# Patient Record
Sex: Female | Born: 1970 | Race: White | Hispanic: No | Marital: Married | Smoking: Never smoker
Health system: Southern US, Community
[De-identification: ages and names within clinical notes are randomized; demographics above are authoritative.]

## PROBLEM LIST (undated history)

## (undated) DIAGNOSIS — K219 Gastro-esophageal reflux disease without esophagitis: Secondary | ICD-10-CM

## (undated) DIAGNOSIS — I10 Essential (primary) hypertension: Secondary | ICD-10-CM

## (undated) HISTORY — PX: OTHER SURGICAL HISTORY: SHX169

## (undated) HISTORY — PX: ESOPHAGOGASTRODUODENOSCOPY: SHX1529

---

## 2000-03-11 HISTORY — PX: TUBAL LIGATION: SHX77

## 2003-11-21 ENCOUNTER — Ambulatory Visit: Payer: Self-pay | Admitting: Family Medicine

## 2004-11-26 ENCOUNTER — Ambulatory Visit: Payer: Self-pay | Admitting: Unknown Physician Specialty

## 2004-12-03 ENCOUNTER — Ambulatory Visit: Payer: Self-pay | Admitting: Unknown Physician Specialty

## 2005-12-21 ENCOUNTER — Ambulatory Visit: Payer: Self-pay | Admitting: Unknown Physician Specialty

## 2005-12-24 ENCOUNTER — Ambulatory Visit: Payer: Self-pay | Admitting: Unknown Physician Specialty

## 2006-03-02 ENCOUNTER — Emergency Department: Payer: Self-pay | Admitting: Unknown Physician Specialty

## 2006-03-02 ENCOUNTER — Other Ambulatory Visit: Payer: Self-pay

## 2006-03-03 ENCOUNTER — Ambulatory Visit: Payer: Self-pay | Admitting: Unknown Physician Specialty

## 2006-07-06 ENCOUNTER — Ambulatory Visit: Payer: Self-pay | Admitting: Unknown Physician Specialty

## 2007-12-28 ENCOUNTER — Ambulatory Visit: Payer: Self-pay | Admitting: Unknown Physician Specialty

## 2008-08-30 HISTORY — PX: COLONOSCOPY: SHX174

## 2008-10-16 ENCOUNTER — Emergency Department: Payer: Self-pay | Admitting: Emergency Medicine

## 2008-12-30 ENCOUNTER — Ambulatory Visit: Payer: Self-pay | Admitting: Unknown Physician Specialty

## 2009-01-20 ENCOUNTER — Ambulatory Visit: Payer: Self-pay | Admitting: Unknown Physician Specialty

## 2009-06-06 ENCOUNTER — Emergency Department: Payer: Self-pay | Admitting: Emergency Medicine

## 2009-11-18 ENCOUNTER — Ambulatory Visit: Payer: Self-pay | Admitting: Unknown Physician Specialty

## 2010-03-03 ENCOUNTER — Ambulatory Visit
Admission: RE | Admit: 2010-03-03 | Discharge: 2010-03-03 | Payer: Self-pay | Source: Home / Self Care | Attending: Family Medicine | Admitting: Family Medicine

## 2010-03-03 DIAGNOSIS — I10 Essential (primary) hypertension: Secondary | ICD-10-CM | POA: Insufficient documentation

## 2010-03-04 ENCOUNTER — Ambulatory Visit
Admission: RE | Admit: 2010-03-04 | Discharge: 2010-03-04 | Payer: Self-pay | Source: Home / Self Care | Attending: Family Medicine | Admitting: Family Medicine

## 2010-03-12 NOTE — Assessment & Plan Note (Signed)
Summary: KNOT ON RIGHT CHEEK AREA/EVM   Vital Signs:  Patient Profile:   40 Years Old Female CC:      Knot on the neck. Height:     65 inches Weight:      233 pounds BMI:     38.91 O2 Sat:      100 % O2 treatment:    Room Air Temp:     98.0 degrees F oral Pulse rate:   98 / minute Pulse rhythm:   regular Resp:     18 per minute BP sitting:   171 / 129  (right arm)  Pt. in pain?   yes    Location:   neck    Intensity:   7    Type:       aching  Vitals Entered By: Levonne Spiller EMT-P (March 03, 2010 12:45 PM)              Is Patient Diabetic? No  Does patient need assistance? Functional Status Self care Ambulation Normal     Serial Vital Signs/Assessments:  Time      Position  BP       Pulse  Resp  Temp     By 1:12                176/119                        Levonne Spiller EMT-P 1:25                169/123                        Levonne Spiller EMT-P   Current Allergies: No known allergies History of Present Illness History from: patient Reason for visit: see chief complaint Chief Complaint: Knot on the neck. History of Present Illness: The patient is reporting that for the past several days she has noticed a swelling on the right side of face and it has progressively getting bigger.  The patient says that it has been tender.  Pt says she has not been ill with URI symptoms or a cold or dental problems or even sinus problems.  The patient says that she has been concerned about it because she has had tenderness and swelling when opening the jaw and eating.  No excessive loss of mouth moisture noted.  The patient says that she did take her BP meds today but has had a problem with getting her BP under better control and is working with her PCP.   The patient denies difficulty with swallowing or breathing.   The patient denies CP and SOB.    REVIEW OF SYSTEMS Constitutional Symptoms      Denies fever, chills, night sweats, weight loss, weight gain, and fatigue.  Eyes       Denies change in vision, eye pain, eye discharge, glasses, contact lenses, and eye surgery. Ear/Nose/Throat/Mouth       Denies hearing loss/aids, change in hearing, ear pain, ear discharge, dizziness, frequent runny nose, frequent nose bleeds, sinus problems, sore throat, hoarseness, and tooth pain or bleeding.  Respiratory       Denies dry cough, productive cough, wheezing, shortness of breath, asthma, bronchitis, and emphysema/COPD.  Cardiovascular       Denies murmurs, chest pain, and tires easily with exhertion.    Gastrointestinal       Denies stomach pain, nausea/vomiting, diarrhea, constipation, blood in bowel  movements, and indigestion. Genitourniary       Denies painful urination, blood or discharge from vagina, kidney stones, and loss of urinary control. Neurological       Complains of headaches.      Denies paralysis, seizures, and fainting/blackouts. Musculoskeletal       Denies muscle pain, joint pain, joint stiffness, decreased range of motion, redness, swelling, muscle weakness, and gout.  Skin       Complains of unusual moles/lumps or sores.      Denies bruising and hair/skin or nail changes.  Psych       Denies mood changes, temper/anger issues, anxiety/stress, speech problems, depression, and sleep problems.  Past History:  Family History: Last updated: 10-Mar-2010 Both Parents deceased per patient  Social History: Last updated: Mar 10, 2010 Never Smoked Alcohol use-no Drug use-no Occupation:  Nurse, children's  Risk Factors: Smoking Status: never (March 10, 2010)  Past Medical History: HTN  Past Surgical History: BTL - February 2003      Family History: Both Parents deceased per patient  Social History: Never Smoked Alcohol use-no Drug use-no Occupation:  Nurse, children's Smoking Status:  never Drug Use:  no Physical Exam General appearance: well developed, well nourished, no acute distress Head:  normocephalic, atraumatic Eyes: conjunctivae and lids normal Pupils: equal, round, reactive to light Ears: normal, no lesions or deformities Nasal: mucosa pink, nonedematous, no septal deviation, turbinates normal Oral/Pharynx: tongue normal, posterior pharynx without erythema or exudate Neck: swollen right parotid gland, no other adenopathy palpated or seen, neck supple,  trachea midline, no masses Chest/Lungs: no rales, wheezes, or rhonchi bilateral, breath sounds equal without effort Heart: regular rate and  rhythm, no murmur Abdomen: soft, non-tender without obvious organomegaly Extremities: normal extremities Neurological: grossly intact and non-focal Skin: no obvious rashes or lesions MSE: oriented to time, place, and person Assessment  Assessed UNSPECIFIED ESSENTIAL HYPERTENSION as deteriorated - Standley Dakins MD New Problems: UNSPECIFIED ESSENTIAL HYPERTENSION (ICD-401.9) SIALADENITIS, RIGHT (ICD-527.2)   Patient Education: Patient and/or caregiver instructed in the following: rest, Tylenol prn. The risks, benefits and possible side effects were clearly explained and discussed with the patient.  The patient verbalized clear understanding.  The patient was given instructions to return if symptoms don't improve, worsen or new changes develop.  If it is not during clinic hours and the patient cannot get back to this clinic then the patient was told to seek medical care at an available urgent care or emergency department.  The patient verbalized understanding.   Demonstrates willingness to comply.  Plan New Medications/Changes: AUGMENTIN 875-125 MG TABS (AMOXICILLIN-POT CLAVULANATE) take 1 by mouth two times a day  #14 x 0, 10-Mar-2010, Standley Dakins MD  Planning Comments:   The patient was given instructions to monitor her BP closely over the course of the day.  She has a home BP monitor.  The patient says that she will see her PCP to discuss her BP in the next several  days. She was instructed to start taking the antibiotics now and to return tomorrow to be re-evaluated. The patient verbalized clear understanding.     Follow Up: Follow up in 1-2 days if no improvement, Follow up on an as needed basis, Follow up with Primary Physician  The patient and/or caregiver has been counseled thoroughly with regard to medications prescribed including dosage, schedule, interactions, rationale for use, and possible side effects and they verbalize understanding.  Diagnoses and expected course of recovery discussed and will return if not improved as  expected or if the condition worsens. Patient and/or caregiver verbalized understanding.  Prescriptions: AUGMENTIN 875-125 MG TABS (AMOXICILLIN-POT CLAVULANATE) take 1 by mouth two times a day  #14 x 0   Entered and Authorized by:   Standley Dakins MD   Signed by:   Standley Dakins MD on 03/03/2010   Method used:   Electronically to        [facility] [address] (retail)       [address]       [place], [place]  [address]       Ph: [phone]       Fax: [phone]   RxID:   [id number]   Patient Instructions: 1)  Check your Blood Pressure regularly. If it is above:140/90  you should make an appointment. 2)  Go to the pharmacy and pick up your prescription (s).  It may take up to 30 mins for electronic prescriptions to be delivered to the pharmacy.  Please call if your pharmacy has not received your prescriptions after 30 minutes.   3)  The risks, benefits and possible side effects were clearly explained and discussed with the patient.  The patient verbalized clear understanding.  The patient was given instructions to return if symptoms don't improve, worsen or new changes develop.  If it is not during clinic hours and the patient cannot get back to this clinic then the patient was told to seek medical care at an available urgent care or emergency department.  The patient  verbalized understanding.    Medication Administration  Medication # 1:    Medication: Clonidine 0.1mg  tab    Diagnosis: HTN    Dose: 2 TABS    Route: po    Exp Date: 02/08/2011    Lot #: [id number]    Patient tolerated medication without complications    Given by: Levonne Spiller EMT-P (March 03, 2010 1:48 PM)    We monitored the patient in the office for 45 minutes and did serial BP assessments.  The patient was given instructions to Return or go to the ER if no improvement or symptoms getting worse.  The patient verbalized clear understanding.  The patient was counseled on the importance of controlling her BP and offered to refer her to a HTN specialist. The patient verbalized clear understanding.  Rodney Langton, MD, CDE, [employer]

## 2010-03-12 NOTE — Assessment & Plan Note (Signed)
Summary: RECHECK FROM YESTERDAY APPT/EVM   History of Present Illness: The patient was in the store and came to have her face evaluated.  She has been taking the antibiotics as prescribed and the swelling is improving.  The patient says that she is starting to feel better.  I told the patient to be sure to return if not resolved completely or new changes develop.  The patient verbalized understanding.   Rodney Langton, MD, CDE, FAAFP   Allergies: No Known Drug Allergies   Complete Medication List: 1)  Lisinopril-hydrochlorothiazide 20-25 Mg Tabs (Lisinopril-hydrochlorothiazide) .Marland Kitchen.. 1 tab per day 2)  Atenolol 100 Mg Tabs (Atenolol) .Marland Kitchen.. 1 tab per day 3)  Prilosec 20 Mg Cpdr (Omeprazole) .Marland Kitchen.. 1 per day 4)  Paxil Cr 37.5 Mg Xr24h-tab (Paroxetine hcl) .Marland Kitchen.. 1 tab per day 5)  Augmentin 875-125 Mg Tabs (Amoxicillin-pot clavulanate) .... Take 1 by mouth two times a day

## 2010-04-14 ENCOUNTER — Ambulatory Visit (INDEPENDENT_AMBULATORY_CARE_PROVIDER_SITE_OTHER): Payer: BC Managed Care – PPO | Admitting: Internal Medicine

## 2010-04-14 ENCOUNTER — Encounter: Payer: Self-pay | Admitting: Internal Medicine

## 2010-04-14 DIAGNOSIS — I10 Essential (primary) hypertension: Secondary | ICD-10-CM

## 2010-04-21 NOTE — Assessment & Plan Note (Signed)
Summary: BP HIGH AND HER LEFT ARM IS TINGLING   Vital Signs:  Patient Profile:   40 Years Old Female CC:      bp high and numbness in left arm starting today Height:     65 inches Pulse rate:   120 / minute BP sitting:   156 / 110  (right arm)                  Current Allergies: No known allergies History of Present Illness Chief Complaint: bp high and numbness in left arm starting today History of Present Illness: patient had her meds adjusted Feb by doubling atenolol and adding norvasc. She claims no missed doses. 1 week ago she started having pins and needles in legs intermittently and today started in left arm. Has genl headache 3/10 but it has been worse yest. Has intermittent nosebleeds which stop quickly, last yest. Denies chest pain, vision changes, limb weakness, speech or thought problems other than the anxiety of "raising 2 teens".  Patient reports all normal on general blood screen last month at Dr. Burnadette Peter [facility]. Never had thyroid problem.  REVIEW OF SYSTEMS Constitutional Symptoms      Denies fever, chills, night sweats, weight loss, weight gain, and fatigue.  Eyes       Denies change in vision, eye pain, eye discharge, glasses, contact lenses, and eye surgery. Ear/Nose/Throat/Mouth       Denies hearing loss/aids, change in hearing, ear pain, ear discharge, dizziness, frequent runny nose, frequent nose bleeds, sinus problems, sore throat, hoarseness, and tooth pain or bleeding.  Respiratory       Denies dry cough, productive cough, wheezing, shortness of breath, asthma, bronchitis, and emphysema/COPD.  Cardiovascular       Complains of tires easily with exhertion.      Denies murmurs and chest pain.    Gastrointestinal       Denies stomach pain, nausea/vomiting, diarrhea, constipation, blood in bowel movements, and indigestion. Genitourniary       Denies painful urination, kidney stones, and loss of urinary control. Neurological       Complains of  headaches, numbness, and tingling.      Denies paralysis, seizures, and fainting/blackouts. Musculoskeletal       Denies muscle pain, joint pain, joint stiffness, decreased range of motion, redness, swelling, muscle weakness, and gout.  Skin       Denies bruising, unusual mles/lumps or sores, and hair/skin or nail changes.  Psych       Denies mood changes, temper/anger issues, anxiety/stress, speech problems, depression, and sleep problems. Physical Exam General appearance: well developed, well nourished, no acute distress Head: normocephalic, atraumatic Eyes: conjunctivae and lids normal, fundi neg for h,e Pupils: equal, round, reactive to light Nasal: mucosa pink, nonedematous, no septal deviation, turbinates normal Oral/Pharynx: tongue normal, posterior pharynx without erythema or exudate Neck: neck supple,  trachea midline, no masses Thyroid: no nodules, masses, tenderness, or enlargement Chest/Lungs: no rales, wheezes, or rhonchi bilateral, breath sounds equal without effort Heart: regular rate and  rhythm, no murmur Abdomen: soft, non-tender without obvious organomegaly. no bruits Extremities: normal extremities. good pulses and cap refill Neurological: grossly intact and non-focal. intact soft touch. Skin: no obvious rashes or lesions MSE: oriented to time, place, and person Assessment New Problems: UNSPECIFIED TACHYCARDIA (ICD-785.0) HYPERTENSION (ICD-401.1)   Plan New Medications/Changes: PROPRANOLOL HCL 20 MG TABS (PROPRANOLOL HCL) 1 by mouth q6-8 hrs as needed rapid pulse  #30 x 0, 04/14/2010, J. Juline Patch  MD   The patient and/or caregiver has been counseled thoroughly with regard to medications prescribed including dosage, schedule, interactions, rationale for use, and possible side effects and they verbalize understanding.  Diagnoses and expected course of recovery discussed and will return if not improved as expected or if the condition worsens. Patient and/or  caregiver verbalized understanding.  Prescriptions: PROPRANOLOL HCL 20 MG TABS (PROPRANOLOL HCL) 1 by mouth q6-8 hrs as needed rapid pulse  #30 x 0   Entered and Authorized by:   J. Juline Patch MD   Signed by:   Shela Commons. Juline Patch MD on 04/14/2010   Method used:   Telephoned to ...       [facility] (retail)       [address], [address]       [place], [place]  [address]       Ph: [phone]       Fax: [phone]   RxID:   [id number]   Patient Instructions: 1)  avoid salt, caffeine, other stimulants. 2)  call Dr. Lin Givens today for recheck in near future and take all of your pill bottles.  3)  out of work rest of today. 4)  to emergency if severe headache, vision problems, nose bleed, chest pain. 5)  call was made to Dr. Silver Huguenin of [facility]. Vitals and eval reported. She will have staff call patient for recheck tomorrow.

## 2010-05-09 ENCOUNTER — Inpatient Hospital Stay: Payer: Self-pay | Admitting: Internal Medicine

## 2010-05-12 NOTE — Letter (Signed)
Summary: History Form  History Form   Imported By: Eugenio Hoes 05/04/2010 11:48:35  _____________________________________________________________________  External Attachment:    Type:   Image     Comment:   External Document

## 2011-04-13 ENCOUNTER — Ambulatory Visit: Payer: Self-pay | Admitting: Unknown Physician Specialty

## 2011-08-16 ENCOUNTER — Ambulatory Visit: Payer: Self-pay

## 2011-08-18 ENCOUNTER — Ambulatory Visit: Payer: Self-pay

## 2012-07-25 ENCOUNTER — Ambulatory Visit: Payer: Self-pay

## 2012-08-21 ENCOUNTER — Ambulatory Visit: Payer: Self-pay | Admitting: Unknown Physician Specialty

## 2012-08-22 LAB — PATHOLOGY REPORT

## 2013-03-07 IMAGING — US TRANSABDOMINAL ULTRASOUND OF PELVIS
1 series · 14 of 25 positions shown · non-contrast
Comparison: none

REASON FOR EXAM: STAT CR [id number] ask for Dr Nurse  abd pain generalized
RLQ pain
COMMENTS:

[Series 1: transabdominal ultrasound of pelvis · 0.28mm/px · 14 of 95 slices shown]
[im 1/95]
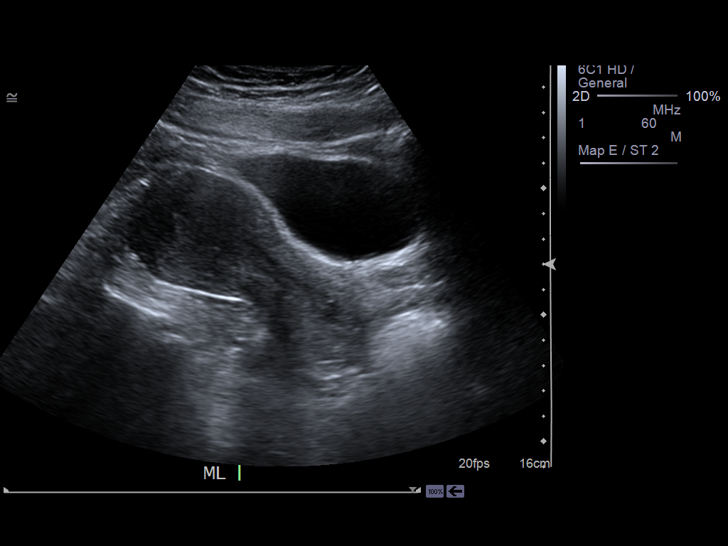
[im 8/95]
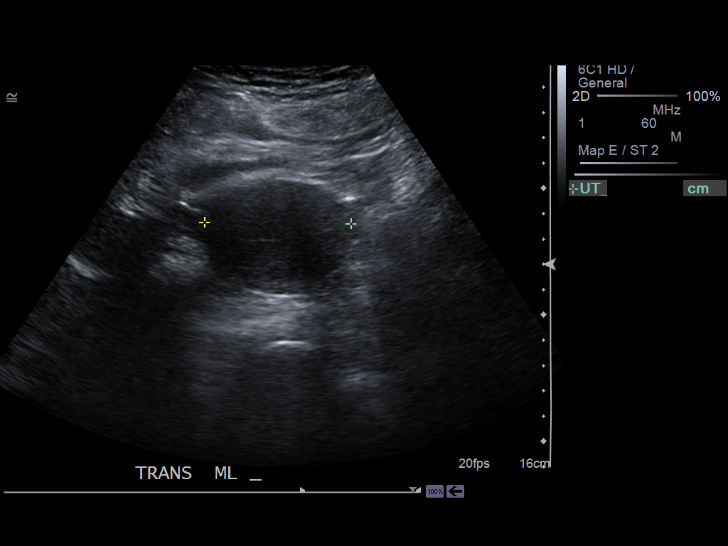
[im 16/95]
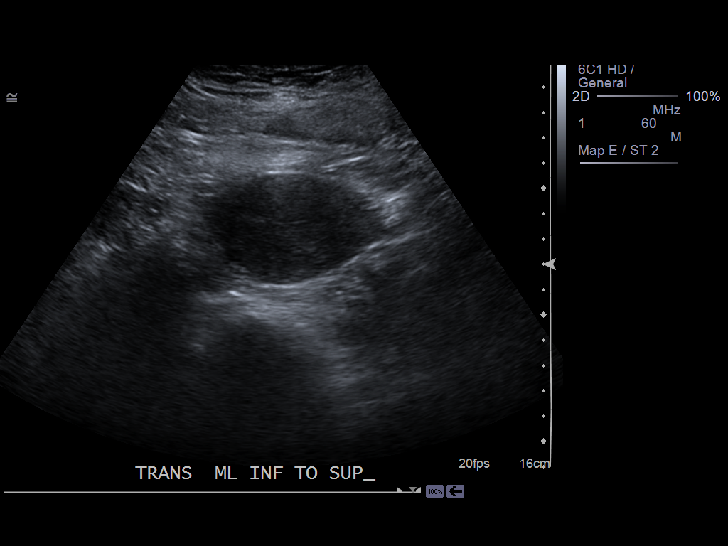
[im 24/95]
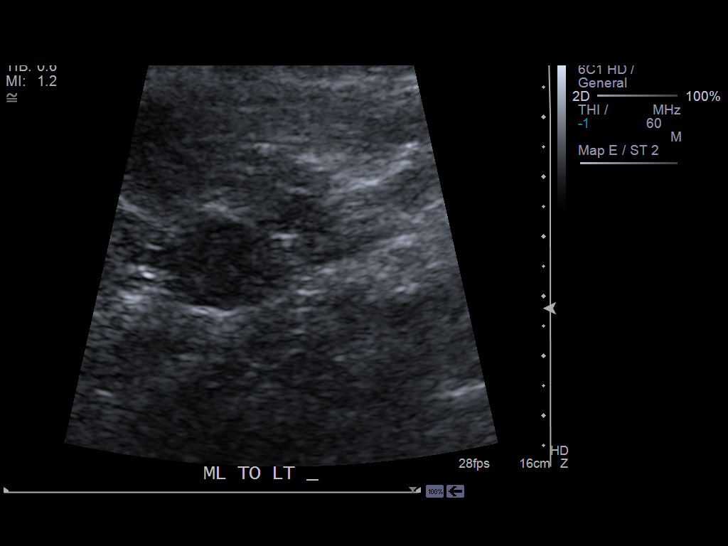
[im 32/95]
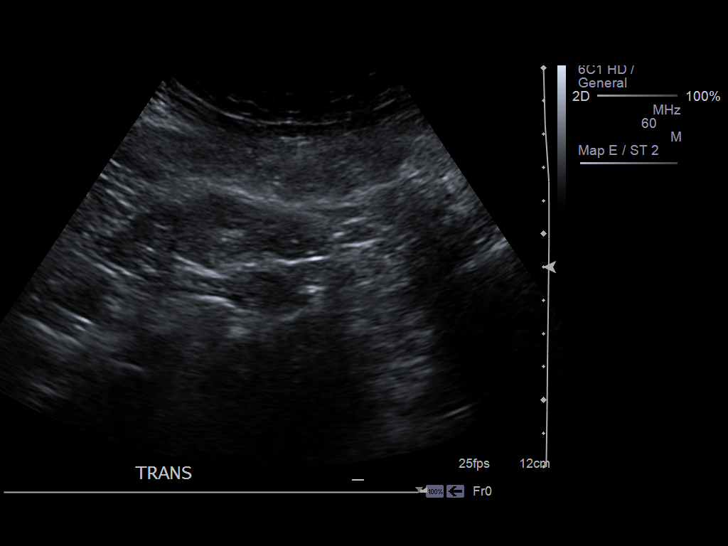
[im 36/95]
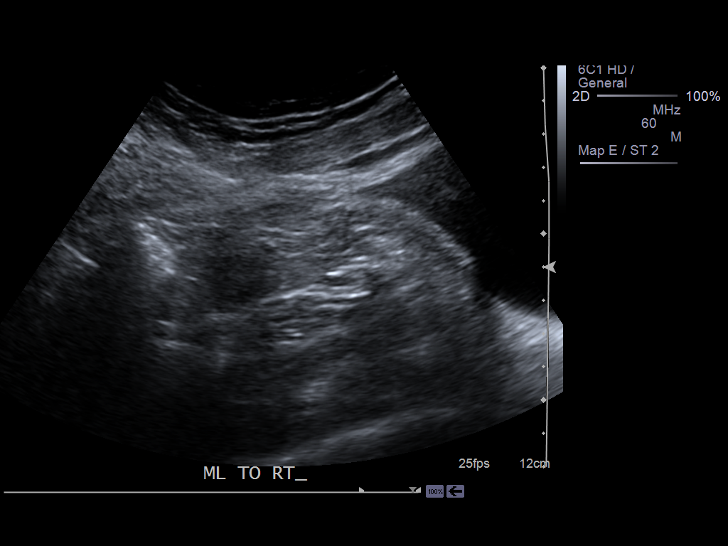
[im 44/95]
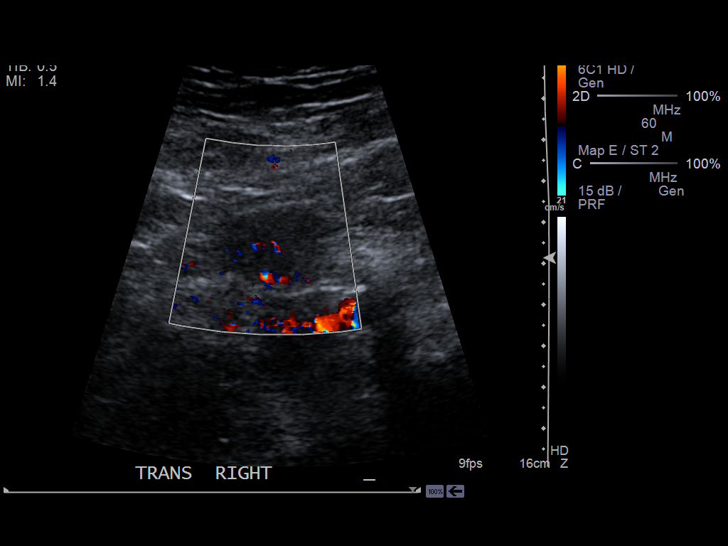
[im 51/95]
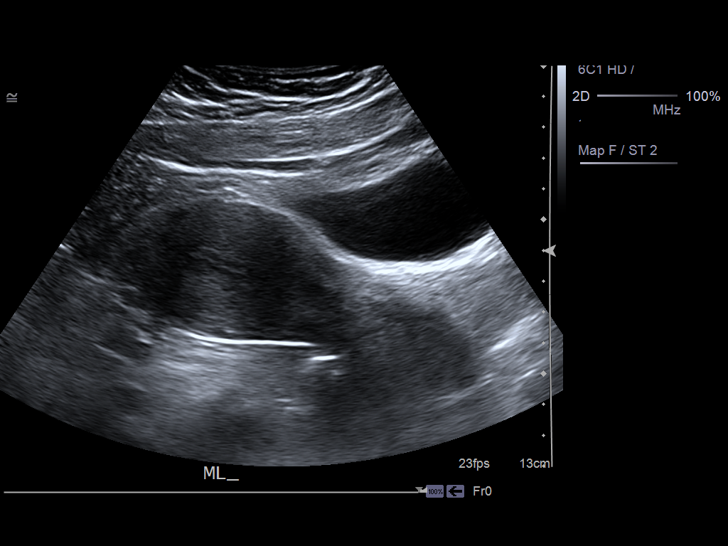
[im 59/95]
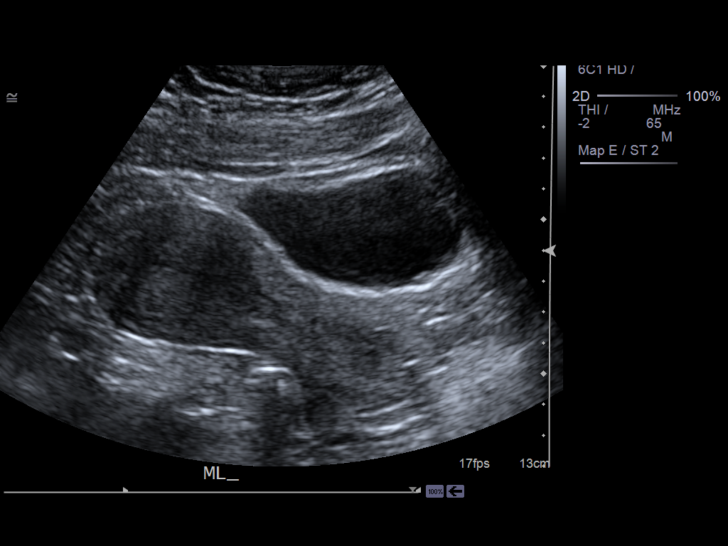
[im 63/95]
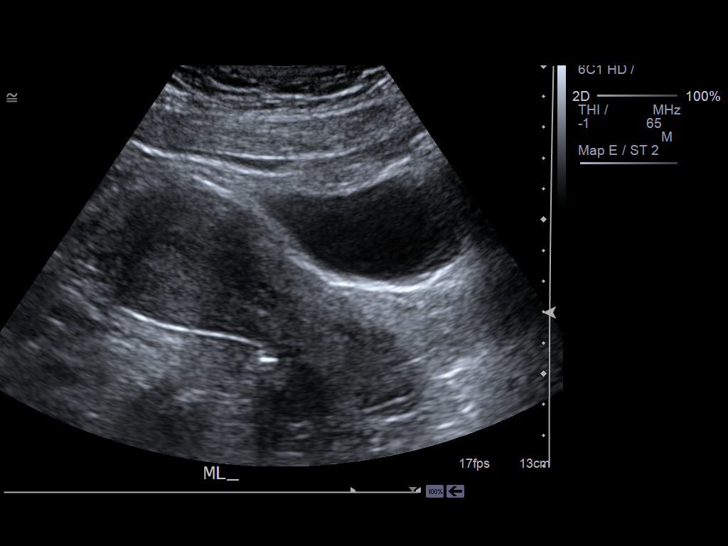
[im 71/95]
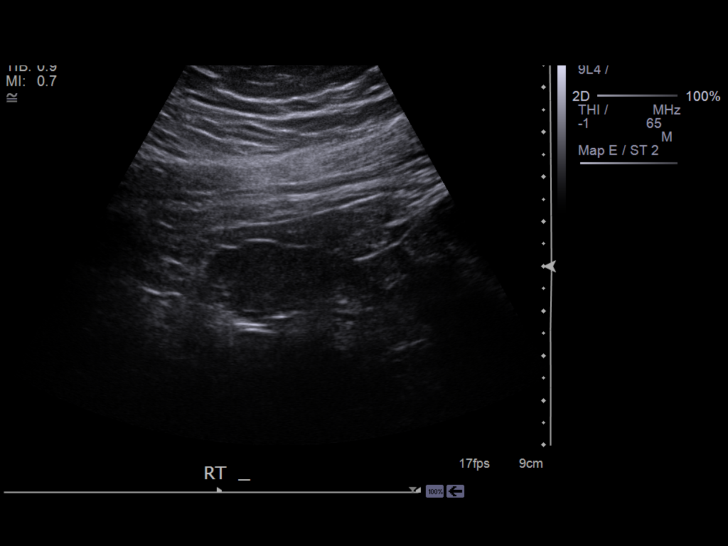
[im 79/95]
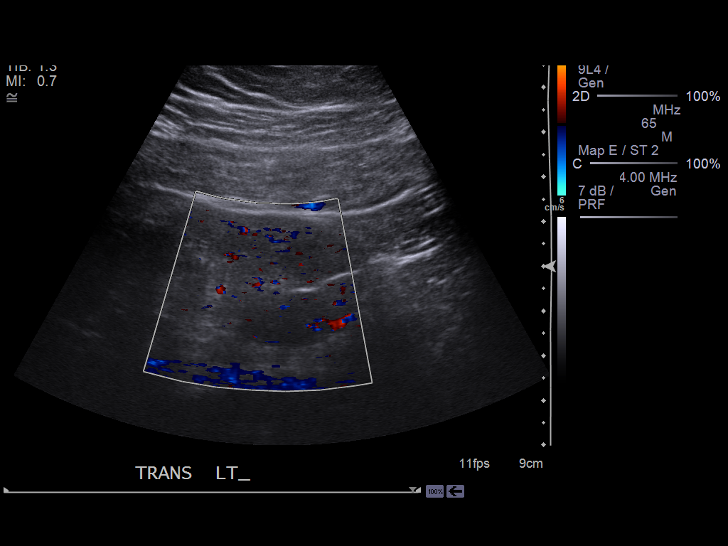
[im 87/95]
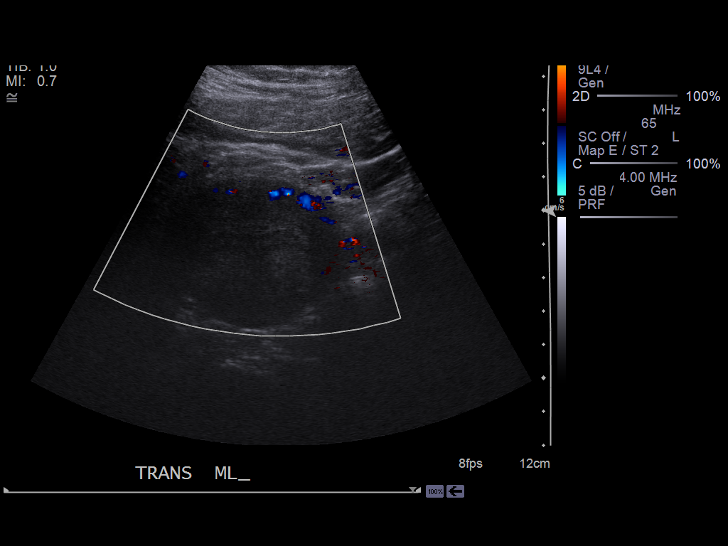
[im 95/95]
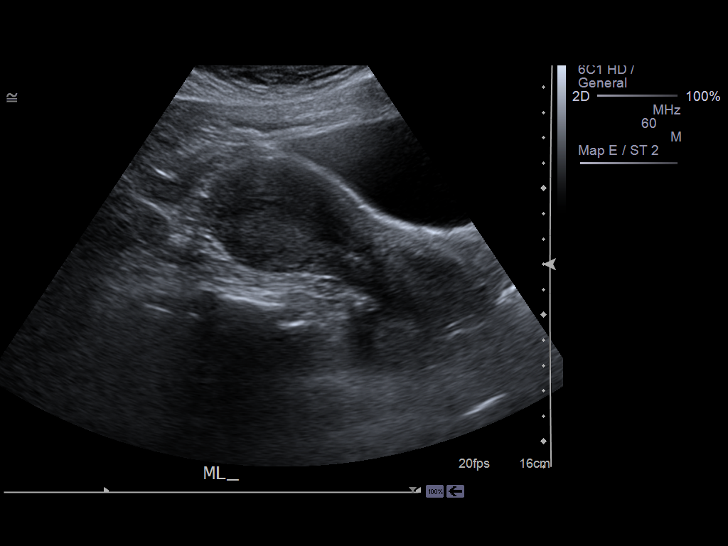

[14 of 25 positions shown; findings below may reference images not displayed]

PROCEDURE:     BARDEN - BARDEN PELVIS NON-OB  - August 16, 2011  [DATE]

RESULT:     The uterus exhibits somewhat heterogeneous echotexture but there
is no focal mass. The uterus measures 10.7 x 5 x 5.8 cm and exhibits a
cm thick endometrium. There is no free fluid in the cul-de-sac. The ovaries
are normal in echotexture and vascularity. The right ovary measures 2.7 x
1.8 x 2 cm. The left ovary measures 2.1 x 1.7 x 2 cm.
IMPRESSION: There is no acute abnormality of the uterus or the adnexa.
If the patient's abdominal and pelvic discomfort persists and remains
unexplained, abdominal and pelvic CT scanning may be useful.

## 2013-03-07 IMAGING — US ABDOMEN ULTRASOUND LIMITED
1 series · 13 of 25 positions shown · non-contrast
Comparison: none

REASON FOR EXAM: STAT CR [id number] ask for Dr Nurse  abd pain generalized
RLQ pain
COMMENTS:

[Series 1: abdomen ultrasound limited · 0.31mm/px · 13 of 104 slices shown]
[im 1/104]
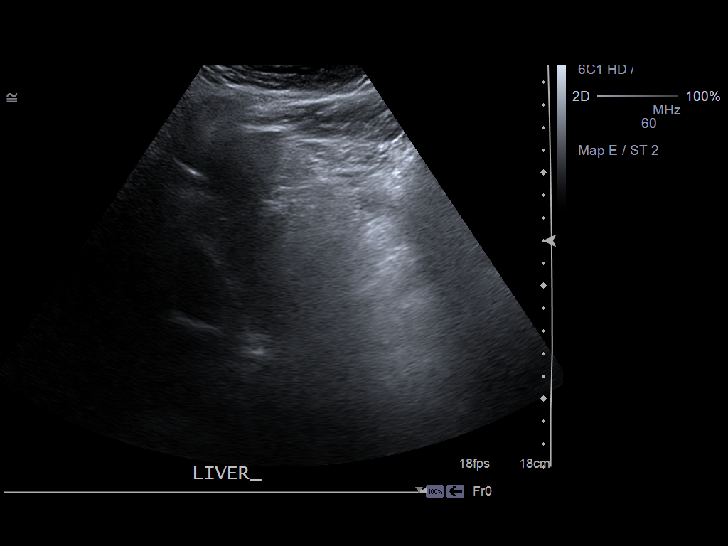
[im 9/104]
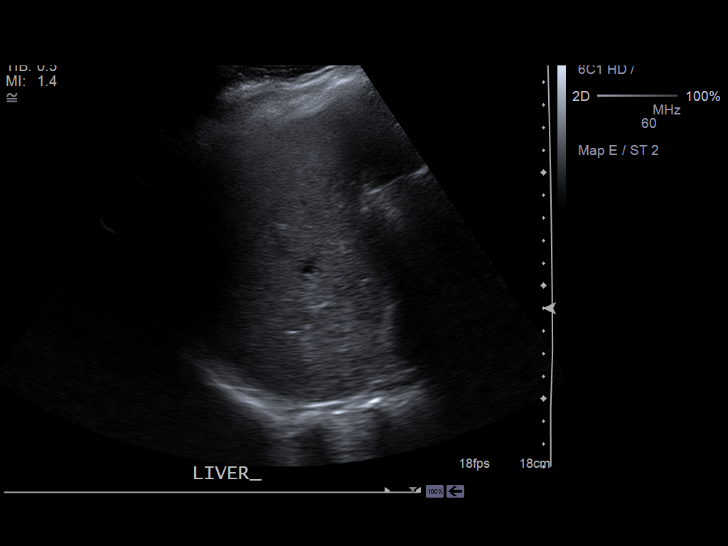
[im 18/104]
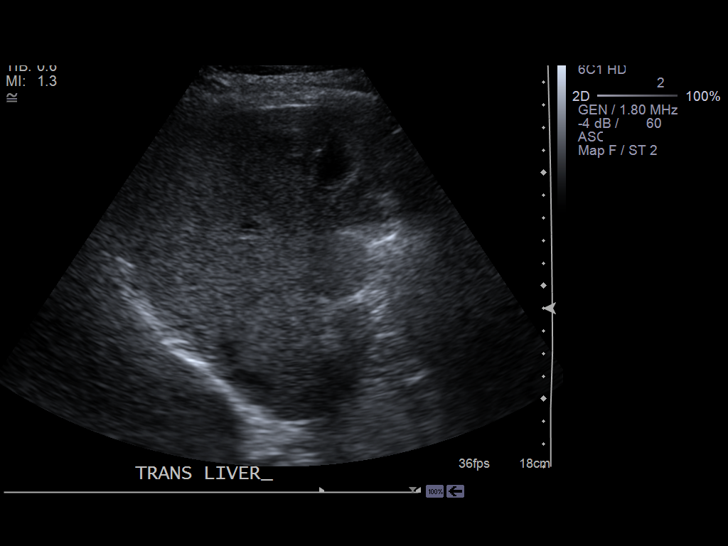
[im 26/104]
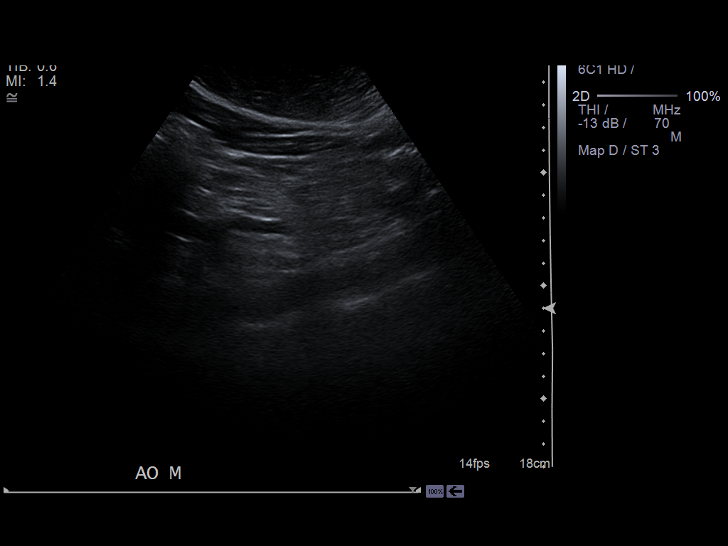
[im 35/104]
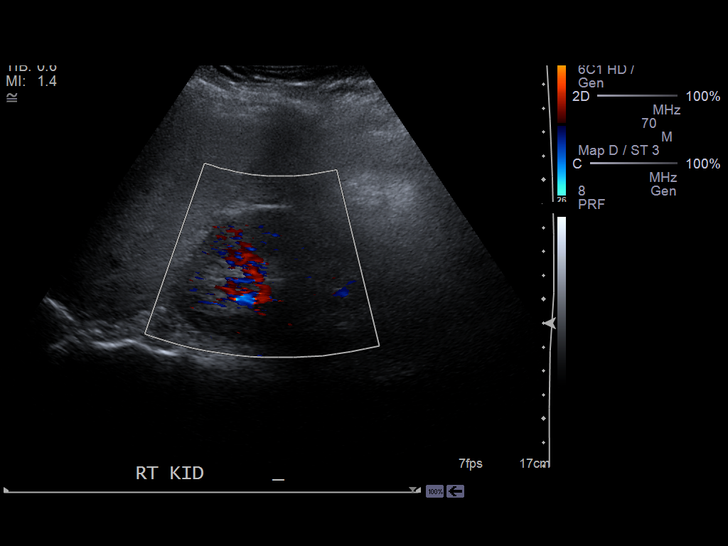
[im 43/104]
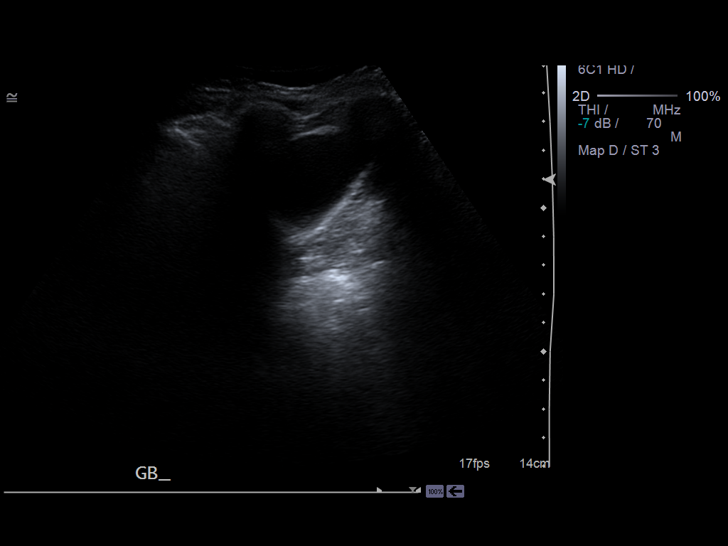
[im 52/104]
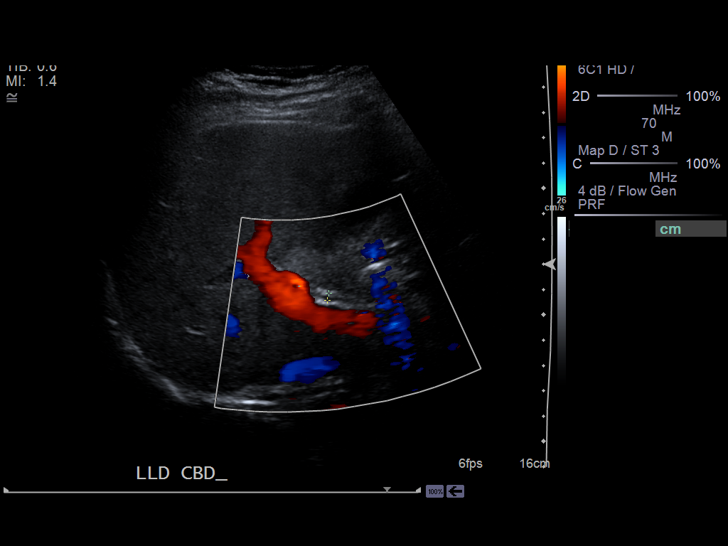
[im 61/104]
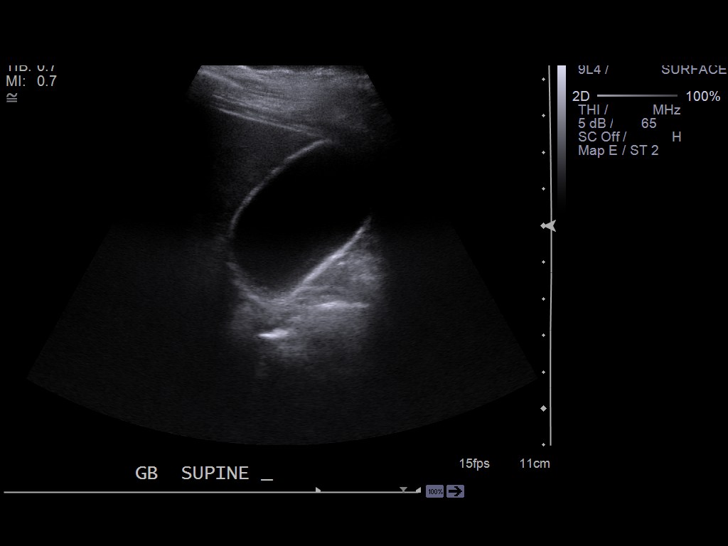
[im 69/104]
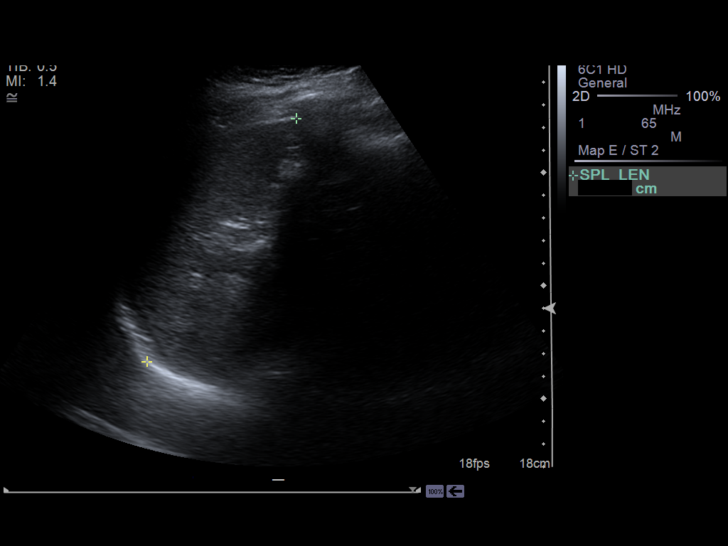
[im 78/104]
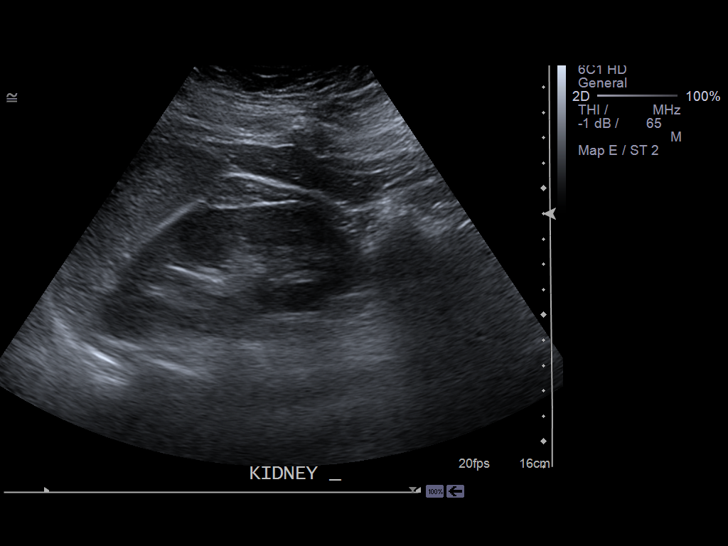
[im 86/104]
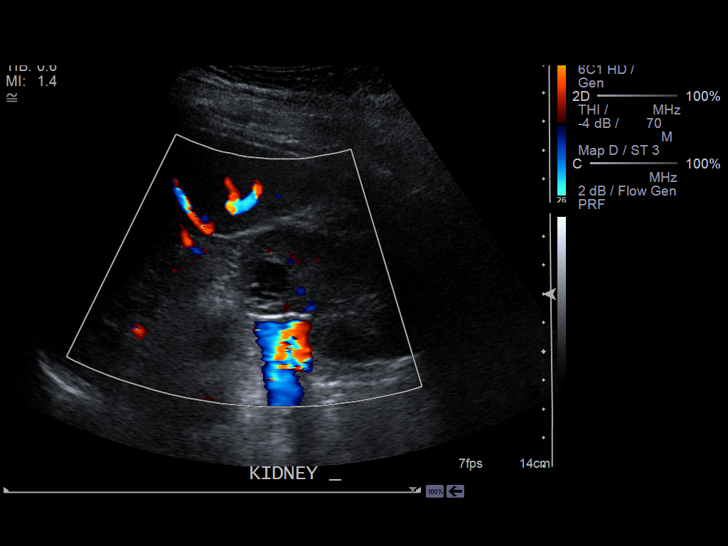
[im 95/104]
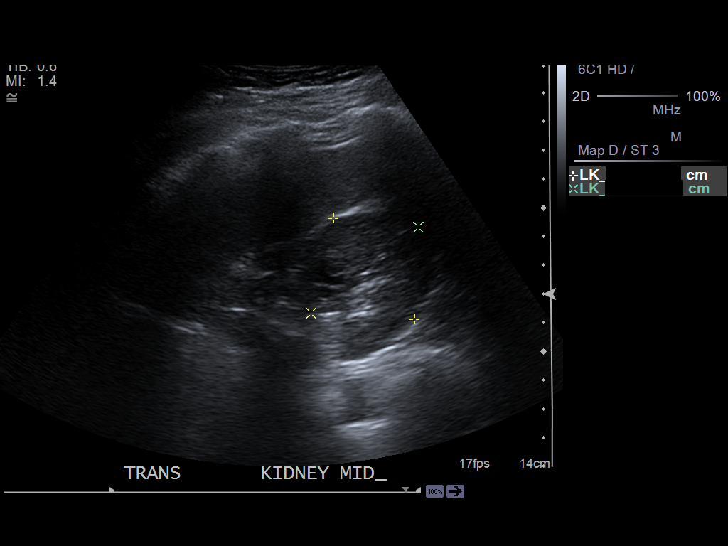
[im 104/104]
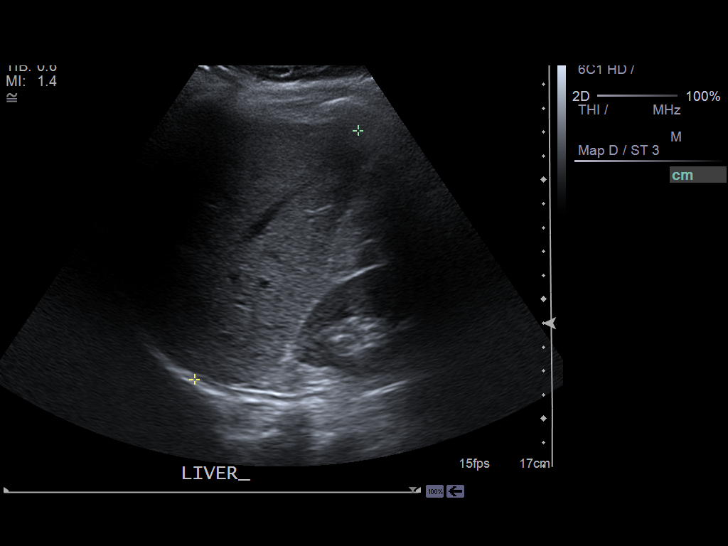

[13 of 25 positions shown; findings below may reference images not displayed]

PROCEDURE:     OUSMANE - OUSMANE ABDOMEN UPPER GENERAL  - August 16, 2011  [DATE]

RESULT:     The liver exhibits a heterogeneous slightly increased
echotexture consistent with fatty infiltration. There is no focal mass or
ductal dilation. Portal venous flow is normal in direction toward the liver.
The pancreas could not be demonstrated due to the presence of bowel gas. The
gallbladder exhibits no evidence of stones  or pericholecystic fluid. The
gallbladder wall is top normal in thickness at 3.2 mm. The common bile duct
is normal at 2.9 cm in diameter. There is no positive sonographic Murphy's
sign. The common bile duct measures 2.9 mm in diameter.

The spleen is top normal at 12.6 cm. Evaluation of the abdominal aorta and
inferior vena cava was limited by bowel gas. The kidneys exhibit no evidence
of obstruction. In the upper pole of the left kidney there is a complex
cystic appearing structure demonstrated.
IMPRESSION: 1. There is borderline splenomegaly.
2. No acute abnormality of the liver or gallbladder is demonstrated. If
there are strong clinical concerns of gallbladder pathology, a nuclear
medicine hepatobiliary scan may be useful.
3. The pancreas could not be demonstrated due to the presence of bowel gas.
4. In the upper pole of the left kidney there is a 1.8 cm diameter complex
appearing cystic structure.

A preliminary report was called to Dr. [REDACTED] at the conclusion
of the study.

## 2013-03-09 IMAGING — CT CT ABDOMEN WO/W CM
2 of 4 series · 12 of 32 positions shown, 18 images · non-contrast
Comparison: none

REASON FOR EXAM: fatty liver  kidney cyst per US
COMMENTS:

[Series 2: wo 3.0 i40f 3 · axial · 0.72mm/px · z∈[-12,+54]mm · 3 of 101 slices shown]
[im 12/101  soft-tissue]
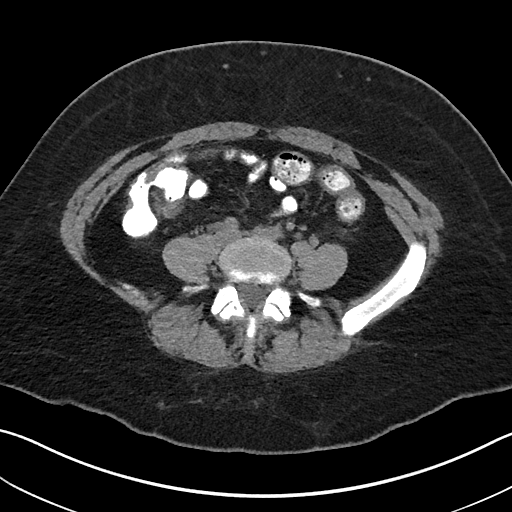
[im 23/101  soft-tissue]
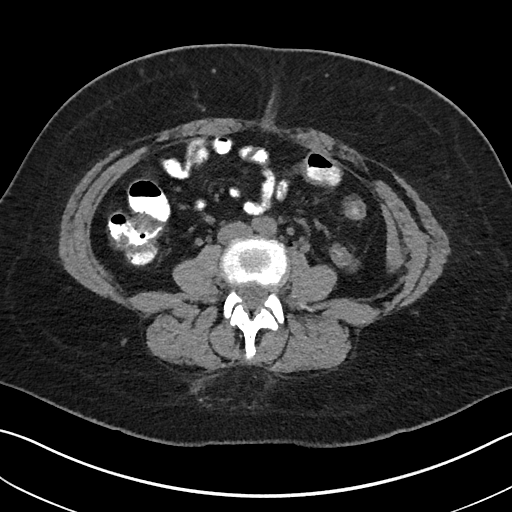
[im 34/101  soft-tissue]
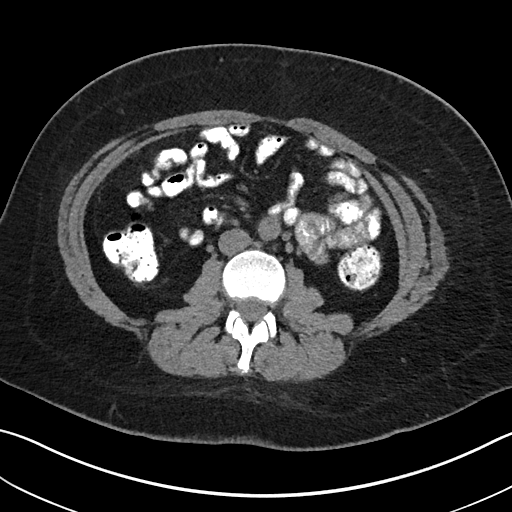

[Series 4: with 3.0 i40f 3 · axial · 0.72mm/px · z∈[-14,+226]mm · 9 of 101 slices shown, 15 images]
[im 11/101  soft-tissue]
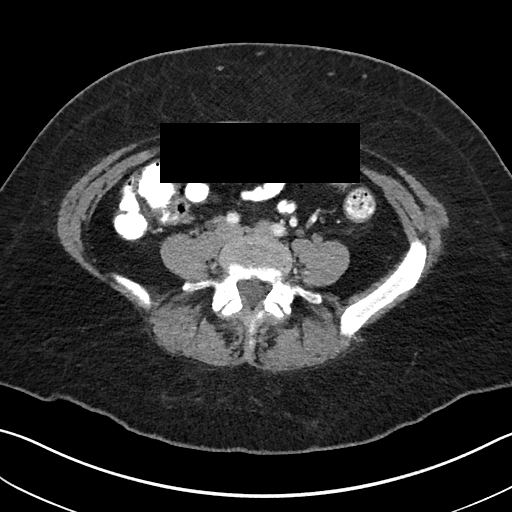
[im 11/101  bone]
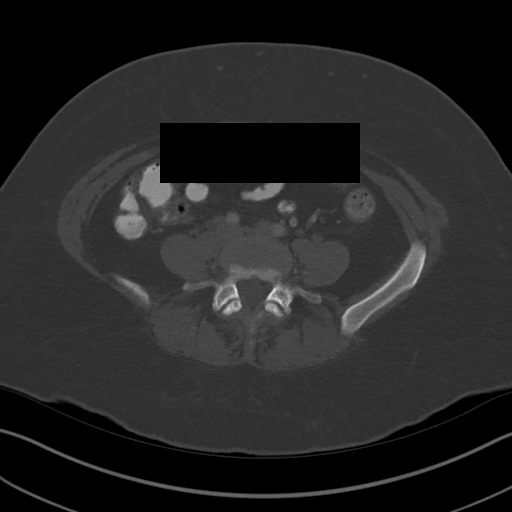
[im 21/101  soft-tissue]
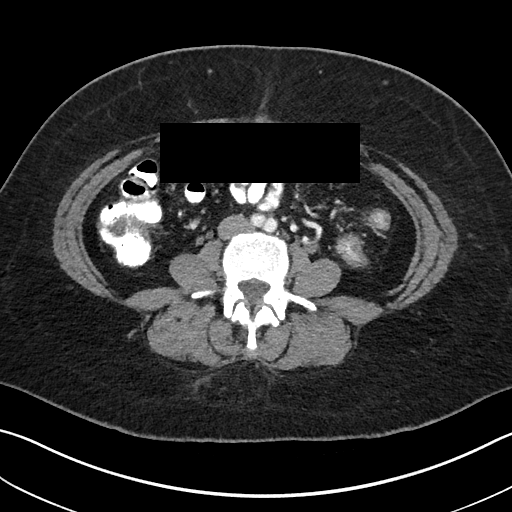
[im 31/101  soft-tissue]
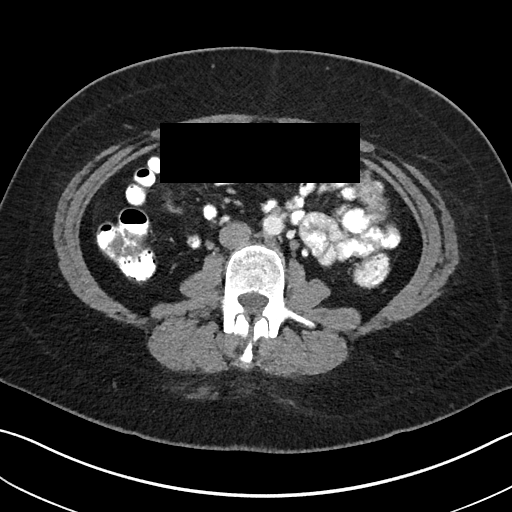
[im 41/101  soft-tissue]
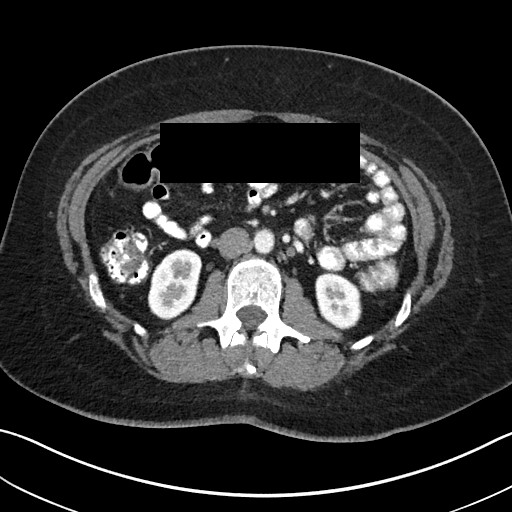
[im 51/101  soft-tissue]
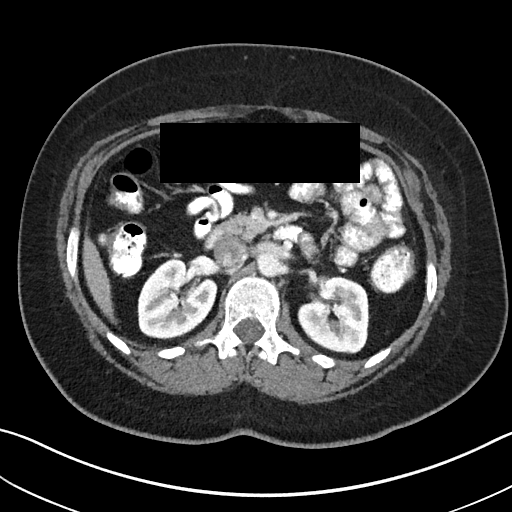
[im 61/101  soft-tissue]
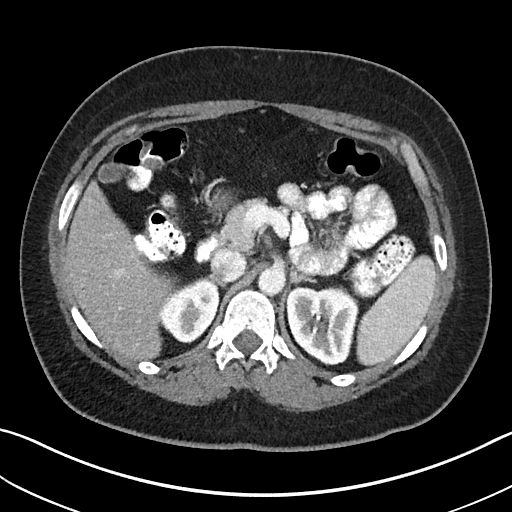
[im 61/101  lung]
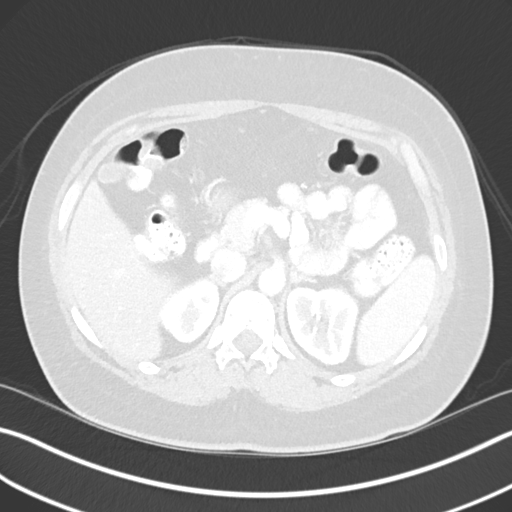
[im 71/101  soft-tissue]
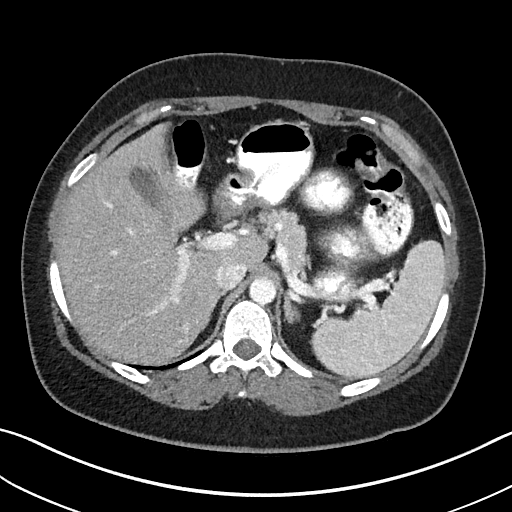
[im 71/101  lung]
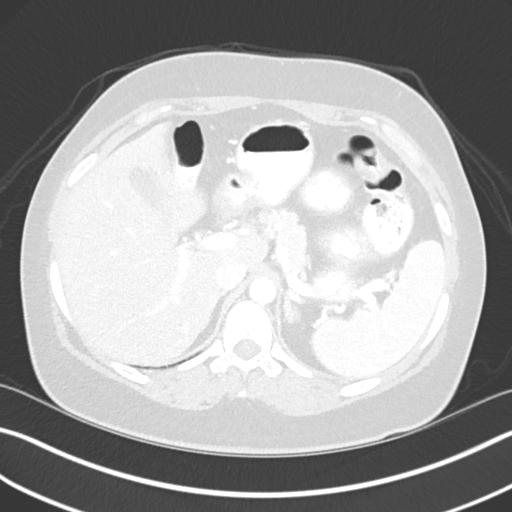
[im 81/101  soft-tissue]
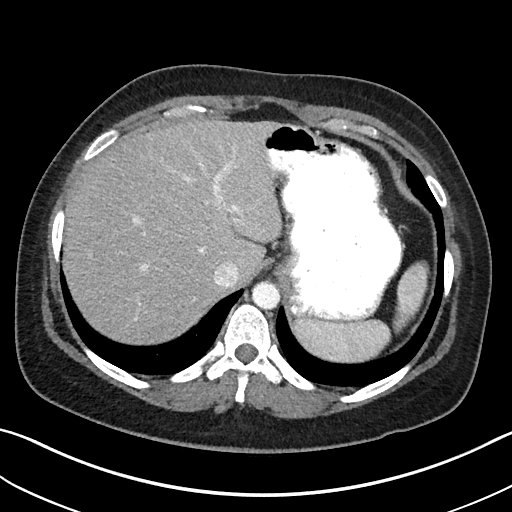
[im 81/101  lung]
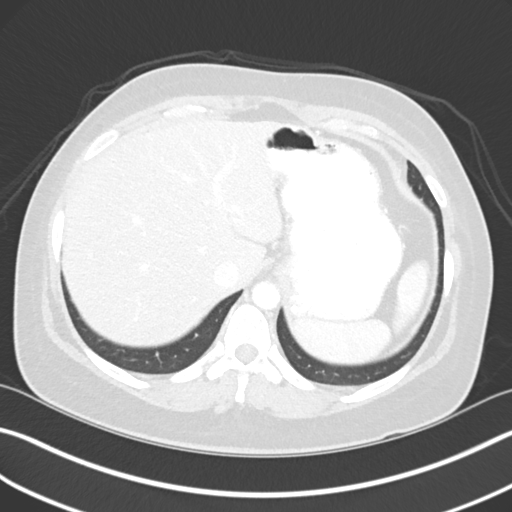
[im 91/101  soft-tissue]
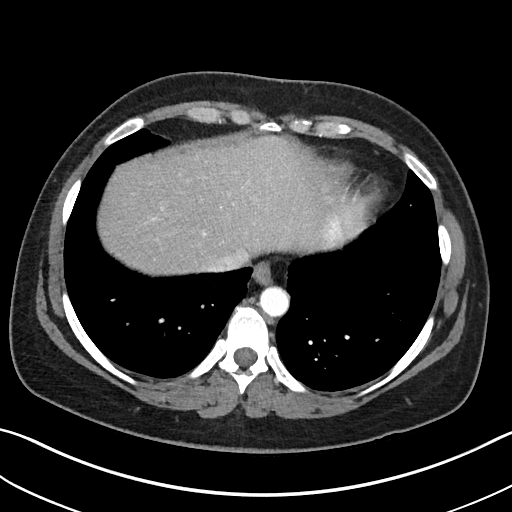
[im 91/101  lung]
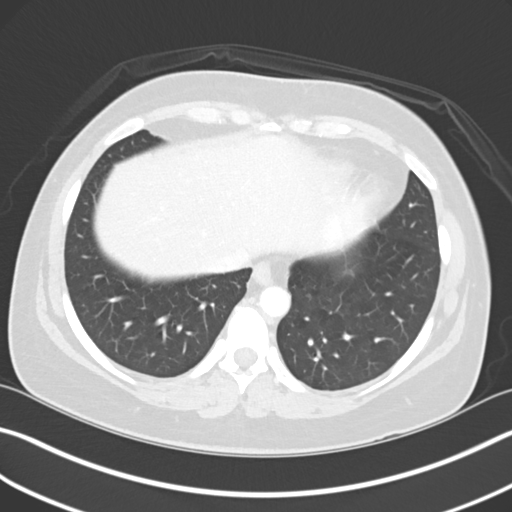
[im 91/101  bone]
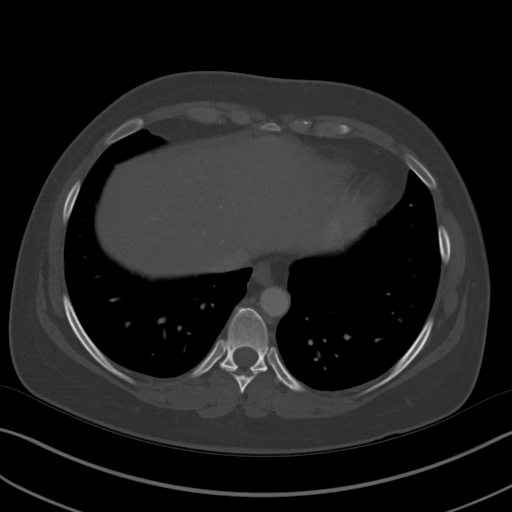

[12 of 32 positions shown; findings below may reference images not displayed]

PROCEDURE:     KCT - KCT ABDOMEN STANDARD W/WO  - August 18, 2011  [DATE]

RESULT:     Axial CT scanning was performed through the abdomen using a
triphasic technique. For the postcontrast images the patient received 85 cc
of [id number]-UYY. Review of multiplanar reconstructed images was performed
separately on the VIA monitor. Comparison is made to the ultrasound August 16, 2011 demonstrating a complex appearing cystic structure in the upper pole
of the left kidney. Comparison is also made to the CT of the abdomen dated
to October 16, 2008.

In the anterior aspect of the mid to upper pole of the left kidney there is
a 1.7 cm diameter hypodensity. It exhibits Hounsfield measurement 15
precontrast, 16 postcontrast, at 13 on delayed images. No other lesions are
demonstrated in the left kidney. No lesions are demonstrated in the right
kidney.

The liver, gallbladder, spleen, moderately distended stomach, pancreas,
adrenal glands, and periaortic and pericaval regions are normal in
appearance. The partially contrast-filled loops of small and large bowel are
normal in appearance. The lung bases are clear. On the lowermost images of
the sagittal reconstructed sequence there is grade 1  anterolisthesis of L5
with respect to S1 due to bilateral pars defects.
IMPRESSION: 1. There is a smoothly marginated hypodensity in the mid to upper pole of
the left kidney which corresponds to the ultrasound findings. It exhibits
Hounsfield measurements that are most compatible with a simple cyst. No
abnormal enhancement pattern or rim enhancement is seen. It is slightly
increased in size from 1.4 cm from the CT scan October 2008. No other
renal lesions are demonstrated.
2. There is no acute abnormality elsewhere within the abdomen.
3. There are bilateral pars defects at L5 with very mild grade 1
anterolisthesis of L5 with respect to S1.

## 2013-07-13 ENCOUNTER — Emergency Department: Payer: Self-pay | Admitting: Emergency Medicine

## 2013-07-13 LAB — CBC WITH DIFFERENTIAL/PLATELET
BASOS ABS: 0.1 10*3/uL (ref 0.0–0.1)
Basophil %: 0.6 %
EOS PCT: 1.5 %
Eosinophil #: 0.1 10*3/uL (ref 0.0–0.7)
HCT: 39.4 % (ref 35.0–47.0)
HGB: 12.9 g/dL (ref 12.0–16.0)
LYMPHS PCT: 17.3 %
Lymphocyte #: 1.5 10*3/uL (ref 1.0–3.6)
MCH: 28.8 pg (ref 26.0–34.0)
MCHC: 32.6 g/dL (ref 32.0–36.0)
MCV: 88 fL (ref 80–100)
Monocyte #: 0.6 x10 3/mm (ref 0.2–0.9)
Monocyte %: 6.3 %
Neutrophil #: 6.7 10*3/uL — ABNORMAL HIGH (ref 1.4–6.5)
Neutrophil %: 74.3 %
PLATELETS: 269 10*3/uL (ref 150–440)
RBC: 4.47 10*6/uL (ref 3.80–5.20)
RDW: 14.2 % (ref 11.5–14.5)
WBC: 9 10*3/uL (ref 3.6–11.0)

## 2013-07-13 LAB — BASIC METABOLIC PANEL
ANION GAP: 6 — AB (ref 7–16)
BUN: 10 mg/dL (ref 7–18)
Calcium, Total: 8.9 mg/dL (ref 8.5–10.1)
Chloride: 106 mmol/L (ref 98–107)
Co2: 27 mmol/L (ref 21–32)
Creatinine: 0.66 mg/dL (ref 0.60–1.30)
EGFR (African American): >60
EGFR (Non-African Amer.): >60
GLUCOSE: 93 mg/dL (ref 65–99)
Osmolality: 276 (ref 275–301)
Potassium: 3.3 mmol/L — ABNORMAL LOW (ref 3.5–5.1)
SODIUM: 139 mmol/L (ref 136–145)

## 2013-07-13 LAB — TROPONIN I

## 2014-01-02 HISTORY — PX: OTHER SURGICAL HISTORY: SHX169

## 2014-08-06 ENCOUNTER — Emergency Department
Admission: EM | Admit: 2014-08-06 | Discharge: 2014-08-06 | Disposition: A | Discharge status: Home / Self Care | Payer: Managed Care, Other (non HMO) | Attending: Emergency Medicine | Admitting: Emergency Medicine

## 2014-08-06 ENCOUNTER — Emergency Department: Payer: Managed Care, Other (non HMO)

## 2014-08-06 ENCOUNTER — Encounter: Payer: Self-pay | Admitting: Medical Oncology

## 2014-08-06 ENCOUNTER — Other Ambulatory Visit: Payer: Self-pay

## 2014-08-06 DIAGNOSIS — I10 Essential (primary) hypertension: Secondary | ICD-10-CM | POA: Insufficient documentation

## 2014-08-06 DIAGNOSIS — K5732 Diverticulitis of large intestine without perforation or abscess without bleeding: Secondary | ICD-10-CM

## 2014-08-06 DIAGNOSIS — K5900 Constipation, unspecified: Secondary | ICD-10-CM | POA: Insufficient documentation

## 2014-08-06 DIAGNOSIS — R109 Unspecified abdominal pain: Secondary | ICD-10-CM | POA: Diagnosis present

## 2014-08-06 HISTORY — DX: Gastro-esophageal reflux disease without esophagitis: K21.9

## 2014-08-06 HISTORY — DX: Essential (primary) hypertension: I10

## 2014-08-06 LAB — URINALYSIS COMPLETE WITH MICROSCOPIC (ARMC ONLY)
BILIRUBIN URINE: NEGATIVE
Glucose, UA: NEGATIVE mg/dL
Hgb urine dipstick: NEGATIVE
Ketones, ur: NEGATIVE mg/dL
Leukocytes, UA: NEGATIVE
Nitrite: NEGATIVE
PROTEIN: NEGATIVE mg/dL
Specific Gravity, Urine: 1.009 (ref 1.005–1.030)
pH: 7 (ref 5.0–8.0)

## 2014-08-06 LAB — CBC WITH DIFFERENTIAL/PLATELET
BASOS ABS: 0.1 10*3/uL (ref 0–0.1)
BASOS PCT: 1 %
EOS PCT: 3 %
Eosinophils Absolute: 0.3 10*3/uL (ref 0–0.7)
HCT: 38.3 % (ref 35.0–47.0)
Hemoglobin: 12.4 g/dL (ref 12.0–16.0)
LYMPHS PCT: 19 %
Lymphs Abs: 2.1 10*3/uL (ref 1.0–3.6)
MCH: 28.6 pg (ref 26.0–34.0)
MCHC: 32.4 g/dL (ref 32.0–36.0)
MCV: 88.3 fL (ref 80.0–100.0)
Monocytes Absolute: 0.8 10*3/uL (ref 0.2–0.9)
Monocytes Relative: 7 %
Neutro Abs: 8.2 10*3/uL — ABNORMAL HIGH (ref 1.4–6.5)
Neutrophils Relative %: 72 %
PLATELETS: 262 10*3/uL (ref 150–440)
RBC: 4.33 MIL/uL (ref 3.80–5.20)
RDW: 15.9 % — ABNORMAL HIGH (ref 11.5–14.5)
WBC: 11.5 10*3/uL — AB (ref 3.6–11.0)

## 2014-08-06 LAB — COMPREHENSIVE METABOLIC PANEL
ALBUMIN: 3.5 g/dL (ref 3.5–5.0)
ALK PHOS: 62 U/L (ref 38–126)
ALT: 13 U/L — ABNORMAL LOW (ref 14–54)
ANION GAP: 7 (ref 5–15)
AST: 16 U/L (ref 15–41)
BUN: 19 mg/dL (ref 6–20)
CALCIUM: 9.1 mg/dL (ref 8.9–10.3)
CO2: 29 mmol/L (ref 22–32)
Chloride: 102 mmol/L (ref 101–111)
Creatinine, Ser: 0.75 mg/dL (ref 0.44–1.00)
GFR calc non Af Amer: >60 mL/min (ref >60)
GLUCOSE: 107 mg/dL — AB (ref 65–99)
Potassium: 3.8 mmol/L (ref 3.5–5.1)
Sodium: 138 mmol/L (ref 135–145)
Total Bilirubin: 0.4 mg/dL (ref 0.3–1.2)
Total Protein: 6.8 g/dL (ref 6.5–8.1)

## 2014-08-06 MED ORDER — OXYCODONE HCL 5 MG PO TABS
10.0000 mg | ORAL_TABLET | Freq: Once | ORAL | Status: AC
  Administered 2014-08-06: 10 mg via ORAL

## 2014-08-06 MED ORDER — OXYCODONE HCL 5 MG PO TABS
ORAL_TABLET | ORAL | Status: AC
  Administered 2014-08-06: 10 mg via ORAL
  Filled 2014-08-06: qty 2

## 2014-08-06 MED ORDER — CIPROFLOXACIN HCL 500 MG PO TABS: 500.0000 mg | ORAL_TABLET | Freq: Two times a day (BID) | ORAL | Status: AC

## 2014-08-06 MED ORDER — METRONIDAZOLE 500 MG PO TABS: 500.0000 mg | ORAL_TABLET | Freq: Two times a day (BID) | ORAL | Status: DC

## 2014-08-06 MED ORDER — POLYETHYLENE GLYCOL 3350 17 G PO PACK: 17.0000 g | PACK | Freq: Every day | ORAL | Status: DC

## 2014-08-06 NOTE — ED Notes (Signed)
Pt presents to ed with c/o rt lower abd pain x 1 week with low grade fever. Reports nausea without vomiting or diarrhea. Denies dysuria.

## 2014-08-06 NOTE — Discharge Instructions (Signed)
Constipation Constipation is when a person:  Poops (has a bowel movement) less than 3 times a week.  Has a hard time pooping.  Has poop that is dry, hard, or bigger than normal. HOME CARE   Eat foods with a lot of fiber in them. This includes fruits, vegetables, beans, and whole grains such as brown rice.  Avoid fatty foods and foods with a lot of sugar. This includes french fries, hamburgers, cookies, candy, and soda.  If you are not getting enough fiber from food, take products with added fiber in them (supplements).  Drink enough fluid to keep your pee (urine) clear or pale yellow.  Exercise on a regular basis, or as told by your doctor.  Go to the restroom when you feel like you need to poop. Do not hold it.  Only take medicine as told by your doctor. Do not take medicines that help you poop (laxatives) without talking to your doctor first. GET HELP RIGHT AWAY IF:   You have bright red blood in your poop (stool).  Your constipation lasts more than 4 days or gets worse.  You have belly (abdominal) or butt (rectal) pain.  You have thin poop (as thin as a pencil).  You lose weight, and it cannot be explained. MAKE SURE YOU:   Understand these instructions.  Will watch your condition.  Will get help right away if you are not doing well or get worse. Document Released: 07/14/2007 Document Revised: 01/30/2013 Document Reviewed: 11/06/2012 [facility] Patient Information 2015 [employer] [place]. This information is not intended to replace advice given to you by your health care provider. Make sure you discuss any questions you have with your health care provider.  Diverticulitis Diverticulitis is inflammation or infection of small pouches in your colon that form when you have a condition called diverticulosis. The pouches in your colon are called diverticula. Your colon, or large intestine, is where water is absorbed and stool is formed. Complications of diverticulitis can  include:  Bleeding.  Severe infection.  Severe pain.  Perforation of your colon.  Obstruction of your colon. CAUSES  Diverticulitis is caused by bacteria. Diverticulitis happens when stool becomes trapped in diverticula. This allows bacteria to grow in the diverticula, which can lead to inflammation and infection. RISK FACTORS People with diverticulosis are at risk for diverticulitis. Eating a diet that does not include enough fiber from fruits and vegetables may make diverticulitis more likely to develop. SYMPTOMS  Symptoms of diverticulitis may include:  Abdominal pain and tenderness. The pain is normally located on the left side of the abdomen, but may occur in other areas.  Fever and chills.  Bloating.  Cramping.  Nausea.  Vomiting.  Constipation.  Diarrhea.  Blood in your stool. DIAGNOSIS  Your health care provider will ask you about your medical history and do a physical exam. You may need to have tests done because many medical conditions can cause the same symptoms as diverticulitis. Tests may include:  Blood tests.  Urine tests.  Imaging tests of the abdomen, including X-rays and CT scans. When your condition is under control, your health care provider may recommend that you have a colonoscopy. A colonoscopy can show how severe your diverticula are and whether something else is causing your symptoms. TREATMENT  Most cases of diverticulitis are mild and can be treated at home. Treatment may include:  Taking over-the-counter pain medicines.  Following a clear liquid diet.  Taking antibiotic medicines by mouth for 7-10 days. More severe cases  may be treated at a hospital. Treatment may include:  Not eating or drinking.  Taking prescription pain medicine.  Receiving antibiotic medicines through an IV tube.  Receiving fluids and nutrition through an IV tube.  Surgery. HOME CARE INSTRUCTIONS   Follow your health care provider's instructions  carefully.  Follow a full liquid diet or other diet as directed by your health care provider. After your symptoms improve, your health care provider may tell you to change your diet. He or she may recommend you eat a high-fiber diet. Fruits and vegetables are good sources of fiber. Fiber makes it easier to pass stool.  Take fiber supplements or probiotics as directed by your health care provider.  Only take medicines as directed by your health care provider.  Keep all your follow-up appointments. SEEK MEDICAL CARE IF:   Your pain does not improve.  You have a hard time eating food.  Your bowel movements do not return to normal. SEEK IMMEDIATE MEDICAL CARE IF:   Your pain becomes worse.  Your symptoms do not get better.  Your symptoms suddenly get worse.  You have a fever.  You have repeated vomiting.  You have bloody or black, tarry stools. MAKE SURE YOU:   Understand these instructions.  Will watch your condition.  Will get help right away if you are not doing well or get worse. Document Released: 11/04/2004 Document Revised: 01/30/2013 Document Reviewed: 12/20/2012 [facility] Patient Information 2015 [place], [place]. This information is not intended to replace advice given to you by your health care provider. Make sure you discuss any questions you have with your health care provider.

## 2014-08-06 NOTE — ED Notes (Signed)

## 2014-08-06 NOTE — ED Provider Notes (Addendum)
[facility] Emergency Department Provider Note     Time seen: ----------------------------------------- 6:27 PM on 08/06/2014 -----------------------------------------    I have reviewed the triage vital signs and the nursing notes.   HISTORY  Chief Complaint Abdominal Pain    HPI Zoe Durham is a [age] y.o. female who presents ER for right lower abdominal pain for last week that sharp stabbing, nothing makes it better or worse. Patient reports low-grade fever with nausea but no other symptoms such as vomiting or diarrhea. Again she has not had a history of this. It is moderate to severe this time.   Past Medical History  Diagnosis Date  . Hypertension   . GERD (gastroesophageal reflux disease)     Patient Active Problem List   Diagnosis Date Noted  . UNSPECIFIED ESSENTIAL HYPERTENSION 03/03/2010    Past Surgical History  Procedure Laterality Date  . Novosure      Allergies Review of patient's allergies indicates no known allergies.  Social History History  Substance Use Topics  . Smoking status: Never Smoker   . Smokeless tobacco: Not on file  . Alcohol Use: No    Review of Systems Constitutional: Negative for fever. Eyes: Negative for visual changes. ENT: Negative for sore throat. Cardiovascular: Negative for chest pain. Respiratory: Negative for shortness of breath. Gastrointestinal: Positive for right lower quadrant pain and nausea. Genitourinary: Negative for dysuria. Musculoskeletal: Negative for back pain. Skin: Negative for rash. Neurological: Negative for headaches, focal weakness or numbness.  10-point ROS otherwise negative.  ____________________________________________   PHYSICAL EXAM:  VITAL SIGNS: ED Triage Vitals  Enc Vitals Group     BP 08/06/14 1812 167/98 mmHg     Pulse Rate 08/06/14 1812 74     Resp 08/06/14 1812 20     Temp 08/06/14 1812 98.3 F (36.8 C)     Temp Source 08/06/14 1812 Oral   SpO2 08/06/14 1812 99 %     Weight 08/06/14 1812 198 lb (89.812 kg)     Height 08/06/14 1812 5\' 6"  (1.676 m)     Head Cir --      Peak Flow --      Pain Score 08/06/14 1818 8     Pain Loc --      Pain Edu? --      Excl. in GC? --     Constitutional: Alert and oriented. Well appearing and in no distress. Eyes: Conjunctivae are normal. PERRL. Normal extraocular movements. ENT   Head: Normocephalic and atraumatic.   Nose: No congestion/rhinnorhea.   Mouth/Throat: Mucous membranes are moist.   Neck: No stridor. Hematological/Lymphatic/Immunilogical: No cervical lymphadenopathy. Cardiovascular: Normal rate, regular rhythm. Normal and symmetric distal pulses are present in all extremities. No murmurs, rubs, or gallops. Respiratory: Normal respiratory effort without tachypnea nor retractions. Breath sounds are clear and equal bilaterally. No wheezes/rales/rhonchi. Gastrointestinal: Right lower quadrant tenderness, no rebound or guarding. Positive bowel sounds Musculoskeletal: Nontender with normal range of motion in all extremities. No joint effusions.  No lower extremity tenderness nor edema. Neurologic:  Normal speech and language. No gross focal neurologic deficits are appreciated. Speech is normal. No gait instability. Skin:  Skin is warm, dry and intact. No rash noted. Psychiatric: Mood and affect are normal. Speech and behavior are normal. Patient exhibits appropriate insight and judgment.  ____________________________________________  ED COURSE:  Pertinent labs & imaging results that were available during my care of the patient were reviewed by me and considered in my medical decision making (see  chart for details). We'll get basic labs, urine and reevaluate. Likely renal colic or muscular ____________________________________________    LABS (pertinent positives/negatives)  Labs Reviewed  CBC WITH DIFFERENTIAL/PLATELET - Abnormal; Notable for the following:    WBC  11.5 (*)    RDW 15.9 (*)    Neutro Abs 8.2 (*)    All other components within normal limits  COMPREHENSIVE METABOLIC PANEL - Abnormal; Notable for the following:    Glucose, Bld 107 (*)    ALT 13 (*)    All other components within normal limits  URINALYSIS COMPLETEWITH MICROSCOPIC ([facility] ONLY) - Abnormal; Notable for the following:    Color, Urine STRAW (*)    APPearance CLEAR (*)    Bacteria, UA RARE (*)    Squamous Epithelial / LPF 0-5 (*)    All other components within normal limits    RADIOLOGY Images were viewed by me Ct abd pelvis IMPRESSION: 1. Sigmoid diverticulosis, with some faint mesenteric stranding in the distal sigmoid colon, potentially a manifestation of subtle diverticulitis. Admittedly this is an indistinct finding. 2. Prominent stool throughout the colon favors constipation. 3. Left kidney upper pole cyst. 4. Grade 1 anterolisthesis at L5-S1 with bilateral L5 pars defects, resulting in mild left foraminal stenosis.  ____________________________________________  FINAL ASSESSMENT AND PLAN  Abdominal pain  Plan: Patient is in no acute distress, labs and x-rays as dictated above. Patient will recover with Cipro and Flagyl given MiraLAX as well. CT scan findings favor more constipation and diverticulitis. She is in no acute distress, has a benign abdominal examination. Stable for outpatient follow-up with her doctor   Emily Filbert, MD   Emily Filbert, MD 08/06/14 2011  Emily Filbert, MD 08/06/14 [phone]

## 2014-09-30 ENCOUNTER — Ambulatory Visit
Admission: RE | Admit: 2014-09-30 | Discharge: 2014-09-30 | Disposition: A | Discharge status: Home / Self Care | Payer: Managed Care, Other (non HMO) | Source: Ambulatory Visit | Attending: Infectious Diseases | Admitting: Infectious Diseases

## 2014-09-30 ENCOUNTER — Other Ambulatory Visit: Payer: Self-pay | Admitting: Infectious Diseases

## 2014-09-30 DIAGNOSIS — Z1231 Encounter for screening mammogram for malignant neoplasm of breast: Secondary | ICD-10-CM | POA: Insufficient documentation

## 2015-07-13 ENCOUNTER — Emergency Department (HOSPITAL_COMMUNITY)
Admission: EM | Admit: 2015-07-13 | Discharge: 2015-07-13 | Disposition: A | Discharge status: Home / Self Care | Payer: Managed Care, Other (non HMO) | Attending: Emergency Medicine | Admitting: Emergency Medicine

## 2015-07-13 ENCOUNTER — Encounter (HOSPITAL_COMMUNITY): Payer: Self-pay | Admitting: Emergency Medicine

## 2015-07-13 ENCOUNTER — Emergency Department (HOSPITAL_COMMUNITY): Payer: Managed Care, Other (non HMO)

## 2015-07-13 DIAGNOSIS — Y999 Unspecified external cause status: Secondary | ICD-10-CM | POA: Diagnosis not present

## 2015-07-13 DIAGNOSIS — Y939 Activity, unspecified: Secondary | ICD-10-CM | POA: Insufficient documentation

## 2015-07-13 DIAGNOSIS — Z23 Encounter for immunization: Secondary | ICD-10-CM | POA: Diagnosis not present

## 2015-07-13 DIAGNOSIS — Y929 Unspecified place or not applicable: Secondary | ICD-10-CM | POA: Diagnosis not present

## 2015-07-13 DIAGNOSIS — S61211A Laceration without foreign body of left index finger without damage to nail, initial encounter: Secondary | ICD-10-CM | POA: Diagnosis not present

## 2015-07-13 DIAGNOSIS — S61215A Laceration without foreign body of left ring finger without damage to nail, initial encounter: Secondary | ICD-10-CM | POA: Insufficient documentation

## 2015-07-13 DIAGNOSIS — W293XXA Contact with powered garden and outdoor hand tools and machinery, initial encounter: Secondary | ICD-10-CM | POA: Insufficient documentation

## 2015-07-13 DIAGNOSIS — I10 Essential (primary) hypertension: Secondary | ICD-10-CM | POA: Diagnosis not present

## 2015-07-13 DIAGNOSIS — S61213A Laceration without foreign body of left middle finger without damage to nail, initial encounter: Secondary | ICD-10-CM | POA: Insufficient documentation

## 2015-07-13 DIAGNOSIS — S6992XA Unspecified injury of left wrist, hand and finger(s), initial encounter: Secondary | ICD-10-CM | POA: Diagnosis present

## 2015-07-13 MED ORDER — CEPHALEXIN 500 MG PO CAPS
500.0000 mg | ORAL_CAPSULE | Freq: Once | ORAL | Status: AC
  Administered 2015-07-13: 500 mg via ORAL
  Filled 2015-07-13: qty 1

## 2015-07-13 MED ORDER — LIDOCAINE HCL (PF) 1 % IJ SOLN
20.0000 mL | Freq: Once | INTRAMUSCULAR | Status: AC
  Administered 2015-07-13: 20 mL
  Filled 2015-07-13: qty 20

## 2015-07-13 MED ORDER — TETANUS-DIPHTH-ACELL PERTUSSIS 5-2.5-18.5 LF-MCG/0.5 IM SUSP
0.5000 mL | Freq: Once | INTRAMUSCULAR | Status: AC
  Administered 2015-07-13: 0.5 mL via INTRAMUSCULAR
  Filled 2015-07-13: qty 0.5

## 2015-07-13 MED ORDER — CEPHALEXIN 500 MG PO CAPS: 500.0000 mg | ORAL_CAPSULE | Freq: Four times a day (QID) | ORAL | Status: DC

## 2015-07-13 MED ORDER — IBUPROFEN 800 MG PO TABS: 800.0000 mg | ORAL_TABLET | Freq: Three times a day (TID) | ORAL | Status: DC

## 2015-07-13 MED ORDER — IBUPROFEN 800 MG PO TABS
800.0000 mg | ORAL_TABLET | Freq: Once | ORAL | Status: AC
  Administered 2015-07-13: 800 mg via ORAL
  Filled 2015-07-13: qty 1

## 2015-07-13 NOTE — ED Notes (Signed)
Pt states she was trimming the bushes with electric hedge trimmers and cut left middle and index fingers.

## 2015-07-13 NOTE — Discharge Instructions (Signed)

## 2015-07-13 NOTE — ED Provider Notes (Signed)
CSN: [id number]     Arrival date & time 07/13/15  1755 History  By signing my name below, I, Evon Slack, attest that this documentation has been prepared under the direction and in the presence of [employer], PA-C. Electronically Signed: Evon Slack, ED Scribe. 07/13/2015. 6:58 PM.    Chief Complaint  Patient presents with  . Extremity Laceration    The history is provided by the patient and medical records. No language interpreter was used.   HPI Comments: Zoe Durham is a 45 y.o. female who presents to the Emergency Department complaining of left hand laceration onset today at 5 PM. Pt presents with lacerations to her left 2nd, 3rd and 4th digits. Pt states that she was she accidentally cut her self with electric hedge clippers. Pt states that the bleeding is controlled. Pt states she cleaned the wounds PTA. Pt states that her tetanus is not UTD. Pt doesn't report any medications PTA.  No hx of diabetes or immunocompromise.    Past Medical History  Diagnosis Date  . Hypertension   . GERD (gastroesophageal reflux disease)    Past Surgical History  Procedure Laterality Date  . Novosure     Family History  Problem Relation Age of Onset  . Breast cancer Neg Hx    Social History  Substance Use Topics  . Smoking status: Never Smoker   . Smokeless tobacco: None  . Alcohol Use: No   OB History    No data available      Review of Systems  Constitutional: Negative for fever.  Gastrointestinal: Negative for nausea and vomiting.  Skin: Positive for wound.  Allergic/Immunologic: Negative for immunocompromised state.  Neurological: Negative for weakness and numbness.  Hematological: Does not bruise/bleed easily.  Psychiatric/Behavioral: The patient is not nervous/anxious.      Allergies  Review of patient's allergies indicates no known allergies.  Home Medications   Prior to Admission medications   Medication Sig Start Date End Date Taking? Authorizing  Provider  cephALEXin (KEFLEX) 500 MG capsule Take 1 capsule (500 mg total) by mouth 4 (four) times daily. 07/13/15   Marsi Turvey, PA-C  ibuprofen (ADVIL,MOTRIN) 800 MG tablet Take 1 tablet (800 mg total) by mouth 3 (three) times daily. 07/13/15   Olivya Sobol, PA-C  metroNIDAZOLE (FLAGYL) 500 MG tablet Take 1 tablet (500 mg total) by mouth 2 (two) times daily. 08/06/14   Emily Filbert, MD  polyethylene glycol [facility] Ethelene Hal) packet Take 17 g by mouth daily. 08/06/14   Emily Filbert, MD   BP 160/100 mmHg  Pulse 73  Temp(Src) 97.7 F (36.5 C) (Oral)  Resp 16  Ht  (1.651 m)  Wt 86.637 kg  BMI 31.78 kg/m2  SpO2 100%   Physical Exam  Constitutional: She is oriented to person, place, and time. She appears well-developed and well-nourished. No distress.  HENT:  Head: Normocephalic and atraumatic.  Eyes: Conjunctivae are normal. No scleral icterus.  Neck: Normal range of motion.  Cardiovascular: Normal rate, regular rhythm, normal heart sounds and intact distal pulses.   No murmur heard. Capillary refill < 3 sec  Pulmonary/Chest: Effort normal and breath sounds normal. No respiratory distress.  Musculoskeletal: Normal range of motion. She exhibits no edema.  Left hand: FROM of all fingers of the left hand, sensation intact, strength 5/5 with flexion/extension of all fingers Left Index finger: 2.7cm laceration and 1.5cm laceration to the tip of the finger Left middle finger: 3.2cm laceration to the tip of  the finger Left ring finger: 1.5cm laceration  Neurological: She is alert and oriented to person, place, and time.  Skin: Skin is warm and dry. She is not diaphoretic.  Psychiatric: She has a normal mood and affect.  Nursing note and vitals reviewed.   ED Course  .Marland KitchenLaceration Repair Date/Time: 07/13/2015 7:15 PM Performed by: Dierdre Forth Authorized by: Dierdre Forth Consent: Verbal consent obtained. Risks and benefits: risks, benefits  and alternatives were discussed Consent given by: patient Patient understanding: patient states understanding of the procedure being performed Patient consent: the patient's understanding of the procedure matches consent given Procedure consent: procedure consent matches procedure scheduled Relevant documents: relevant documents present and verified Site marked: the operative site was marked Imaging studies: imaging studies available Required items: required blood products, implants, devices, and special equipment available Patient identity confirmed: arm band and verbally with patient Time out: Immediately prior to procedure a "time out" was called to verify the correct patient, procedure, equipment, support staff and site/side marked as required. Body area: upper extremity Location details: left index finger Laceration length: 2.7 cm Foreign bodies: no foreign bodies Tendon involvement: none Nerve involvement: none Vascular damage: no Anesthesia: digital block Local anesthetic: lidocaine 1% without epinephrine Anesthetic total: 4 ml Patient sedated: no Preparation: Patient was prepped and draped in the usual sterile fashion. Irrigation solution: saline Irrigation method: syringe Amount of cleaning: extensive Skin closure: 5-0 nylon Number of sutures: 4 Technique: simple Approximation: close Approximation difficulty: complex Dressing: 4x4 sterile gauze Patient tolerance: Patient tolerated the procedure well with no immediate complications  .Marland KitchenLaceration Repair Date/Time: 07/13/2015 7:28 PM Performed by: Dierdre Forth Authorized by: Dierdre Forth Consent: Verbal consent obtained. Risks and benefits: risks, benefits and alternatives were discussed Consent given by: patient Patient understanding: patient states understanding of the procedure being performed Patient consent: the patient's understanding of the procedure matches consent given Procedure consent:  procedure consent matches procedure scheduled Relevant documents: relevant documents present and verified Site marked: the operative site was marked Imaging studies: imaging studies available Required items: required blood products, implants, devices, and special equipment available Patient identity confirmed: verbally with patient and arm band Time out: Immediately prior to procedure a "time out" was called to verify the correct patient, procedure, equipment, support staff and site/side marked as required. Body area: upper extremity Location details: left index finger Laceration length: 1.5 cm Foreign bodies: no foreign bodies Tendon involvement: none Nerve involvement: none Vascular damage: no Anesthesia: digital block Local anesthetic: lidocaine 1% without epinephrine Anesthetic total: 4 ml Patient sedated: no Preparation: Patient was prepped and draped in the usual sterile fashion. Irrigation solution: saline Irrigation method: syringe Amount of cleaning: extensive Skin closure: 5-0 nylon Number of sutures: 2 Technique: simple Approximation: loose Approximation difficulty: complex Dressing: 4x4 sterile gauze Patient tolerance: Patient tolerated the procedure well with no immediate complications  .Marland KitchenLaceration Repair Date/Time: 07/13/2015 7:21 PM Performed by: Dierdre Forth Authorized by: Dierdre Forth Risks and benefits: risks, benefits and alternatives were discussed Consent given by: patient Patient understanding: patient states understanding of the procedure being performed Patient consent: the patient's understanding of the procedure matches consent given Procedure consent: procedure consent matches procedure scheduled Relevant documents: relevant documents present and verified Site marked: the operative site was marked Imaging studies: imaging studies available Required items: required blood products, implants, devices, and special equipment  available Patient identity confirmed: verbally with patient and arm band Time out: Immediately prior to procedure a "time out" was called to verify the correct patient, procedure,  equipment, support staff and site/side marked as required. Body area: upper extremity Location details: left long finger Laceration length: 3.2 cm Foreign bodies: no foreign bodies Tendon involvement: none Nerve involvement: none Vascular damage: no Anesthesia: digital block Local anesthetic: lidocaine 1% without epinephrine Anesthetic total: 4 ml Patient sedated: no Preparation: Patient was prepped and draped in the usual sterile fashion. Irrigation solution: saline Irrigation method: syringe Amount of cleaning: extensive Skin closure: 5-0 nylon Number of sutures: 5 Technique: simple Approximation: close Approximation difficulty: complex Dressing: 4x4 sterile gauze Patient tolerance: Patient tolerated the procedure well with no immediate complications  .Marland Kitchen.Laceration Repair Date/Time: 07/13/2015 7:30 PM Performed by: Dierdre ForthMUTHERSBAUGH, Cheston Coury Authorized by: Dierdre ForthMUTHERSBAUGH, [employer] Risks and benefits: risks, benefits and alternatives were discussed Consent given by: patient Patient understanding: patient states understanding of the procedure being performed Patient consent: the patient's understanding of the procedure matches consent given Procedure consent: procedure consent matches procedure scheduled Relevant documents: relevant documents present and verified Site marked: the operative site was marked Imaging studies: imaging studies available Required items: required blood products, implants, devices, and special equipment available Patient identity confirmed: verbally with patient and arm band Time out: Immediately prior to procedure a "time out" was called to verify the correct patient, procedure, equipment, support staff and site/side marked as required. Body area: upper extremity Location details: left  ring finger Laceration length: 1.5 cm Foreign bodies: no foreign bodies Tendon involvement: none Nerve involvement: none Vascular damage: no Local anesthetic: lidocaine 1% without epinephrine Anesthetic total: 4 ml Patient sedated: no Preparation: Patient was prepped and draped in the usual sterile fashion. Irrigation solution: saline Irrigation method: syringe Amount of cleaning: extensive Skin closure: 5-0 nylon Number of sutures: 2 Technique: simple Approximation: close Approximation difficulty: complex Dressing: 4x4 sterile gauze Patient tolerance: Patient tolerated the procedure well with no immediate complications   (including critical care time)  DIAGNOSTIC STUDIES: Oxygen Saturation is 100% on RA, normal by my interpretation.    COORDINATION OF CARE: 6:57 PM-Discussed treatment plan with pt at bedside and pt agreed to plan.    Imaging Review Dg Hand Complete Left  07/13/2015  CLINICAL DATA:  45 year old female with acute laceration injury to left hand. Initial encounter. EXAM: LEFT HAND - COMPLETE 3+ VIEW COMPARISON:  None. FINDINGS: A probable fracture at the tip of the middle finger tuft noted. No other fracture, subluxation or dislocation identified. No radiopaque foreign bodies are noted. IMPRESSION: Probable tuft fracture of the middle finger. Electronically Signed   By: Harmon PierJeffrey  Hu M.D.   On: 07/13/2015 18:52   I have personally reviewed and evaluated these images as part of my medical decision-making.    MDM   Final diagnoses:  Laceration of left index finger w/o foreign body w/o damage to nail, initial encounter  Laceration of left middle finger w/o foreign body w/o damage to nail, initial encounter  Laceration of left ring finger w/o foreign body w/o damage to nail, initial encounter   Zoe Durham presents with laceration to the left ring, middle and index fingers.  Pressure irrigation performed. Wound explored and base of wound visualized in a bloodless  field without evidence of foreign body.  Laceration occurred < 8 hours prior to repair which was well tolerated.  Tdap updated.  Pt has no comorbidities to effect normal wound healing. Pt discharged with antibiotics as x-ray shows a small open tuft fracture of the left middle finger.  Discussed suture home care with patient and answered questions. Pt to follow-up for wound  check and suture removal in 7 days; they are to return to the ED sooner for signs of infection. Pt is hemodynamically stable with no complaints prior to dc.   I personally performed the services described in this documentation, which was scribed in my presence. The recorded information has been reviewed and is accurate.   Dahlia Client Oluwasemilore Pascuzzi, PA-C 07/13/15 2100  Marily Memos, MD 07/13/15 2252

## 2015-09-08 ENCOUNTER — Ambulatory Visit
Admission: RE | Admit: 2015-09-08 | Payer: Managed Care, Other (non HMO) | Source: Ambulatory Visit | Admitting: Unknown Physician Specialty

## 2015-09-08 ENCOUNTER — Other Ambulatory Visit: Payer: Self-pay | Admitting: Infectious Diseases

## 2015-09-08 ENCOUNTER — Encounter: Admission: RE | Payer: Self-pay | Source: Ambulatory Visit

## 2015-09-08 DIAGNOSIS — Z1231 Encounter for screening mammogram for malignant neoplasm of breast: Secondary | ICD-10-CM

## 2015-09-08 SURGERY — ESOPHAGOGASTRODUODENOSCOPY (EGD) WITH PROPOFOL
Anesthesia: General

## 2015-10-06 ENCOUNTER — Encounter: Payer: Self-pay | Admitting: Radiology

## 2015-10-06 ENCOUNTER — Ambulatory Visit
Admission: RE | Admit: 2015-10-06 | Discharge: 2015-10-06 | Disposition: A | Discharge status: Home / Self Care | Payer: Managed Care, Other (non HMO) | Source: Ambulatory Visit | Attending: Infectious Diseases | Admitting: Infectious Diseases

## 2015-10-06 DIAGNOSIS — Z1231 Encounter for screening mammogram for malignant neoplasm of breast: Secondary | ICD-10-CM | POA: Diagnosis present

## 2016-05-03 ENCOUNTER — Other Ambulatory Visit: Payer: Self-pay | Admitting: Obstetrics and Gynecology

## 2016-05-03 DIAGNOSIS — Z1231 Encounter for screening mammogram for malignant neoplasm of breast: Secondary | ICD-10-CM

## 2016-12-06 ENCOUNTER — Ambulatory Visit
Admission: RE | Admit: 2016-12-06 | Discharge: 2016-12-06 | Disposition: A | Discharge status: Home / Self Care | Payer: Managed Care, Other (non HMO) | Source: Ambulatory Visit | Attending: Obstetrics and Gynecology | Admitting: Obstetrics and Gynecology

## 2016-12-06 DIAGNOSIS — Z1231 Encounter for screening mammogram for malignant neoplasm of breast: Secondary | ICD-10-CM | POA: Diagnosis present

## 2017-05-10 ENCOUNTER — Other Ambulatory Visit: Payer: Self-pay | Admitting: Obstetrics and Gynecology

## 2017-05-10 DIAGNOSIS — Z1231 Encounter for screening mammogram for malignant neoplasm of breast: Secondary | ICD-10-CM

## 2018-01-10 ENCOUNTER — Ambulatory Visit
Admission: RE | Admit: 2018-01-10 | Discharge: 2018-01-10 | Disposition: A | Discharge status: Home / Self Care | Payer: BLUE CROSS/BLUE SHIELD | Source: Ambulatory Visit | Attending: Obstetrics and Gynecology | Admitting: Obstetrics and Gynecology

## 2018-01-10 DIAGNOSIS — Z1231 Encounter for screening mammogram for malignant neoplasm of breast: Secondary | ICD-10-CM | POA: Insufficient documentation

## 2019-02-11 ENCOUNTER — Other Ambulatory Visit: Payer: Self-pay | Admitting: Orthopedic Surgery

## 2019-02-12 ENCOUNTER — Encounter: Payer: Self-pay | Admitting: Orthopedic Surgery

## 2019-02-12 ENCOUNTER — Other Ambulatory Visit: Payer: Self-pay

## 2019-02-15 ENCOUNTER — Other Ambulatory Visit
Admission: RE | Admit: 2019-02-15 | Discharge: 2019-02-15 | Disposition: A | Discharge status: Home / Self Care | Payer: Managed Care, Other (non HMO) | Source: Ambulatory Visit | Attending: Orthopedic Surgery | Admitting: Orthopedic Surgery

## 2019-02-15 ENCOUNTER — Other Ambulatory Visit: Payer: Self-pay

## 2019-02-15 DIAGNOSIS — Z20822 Contact with and (suspected) exposure to covid-19: Secondary | ICD-10-CM | POA: Diagnosis not present

## 2019-02-15 DIAGNOSIS — Z01812 Encounter for preprocedural laboratory examination: Secondary | ICD-10-CM | POA: Diagnosis present

## 2019-02-16 LAB — SARS CORONAVIRUS 2 (TAT 6-24 HRS): SARS Coronavirus 2: NEGATIVE

## 2019-02-19 ENCOUNTER — Encounter: Payer: Self-pay | Admitting: Orthopedic Surgery

## 2019-02-19 ENCOUNTER — Other Ambulatory Visit: Payer: Self-pay

## 2019-02-19 ENCOUNTER — Ambulatory Visit: Payer: Managed Care, Other (non HMO) | Admitting: Anesthesiology

## 2019-02-19 ENCOUNTER — Encounter
Admission: RE | Disposition: A | Discharge status: Home / Self Care | Payer: Self-pay | Source: Home / Self Care | Attending: Orthopedic Surgery

## 2019-02-19 ENCOUNTER — Ambulatory Visit
Admission: RE | Admit: 2019-02-19 | Discharge: 2019-02-19 | Disposition: A | Discharge status: Home / Self Care | Payer: Managed Care, Other (non HMO) | Attending: Orthopedic Surgery | Admitting: Orthopedic Surgery

## 2019-02-19 DIAGNOSIS — Z79899 Other long term (current) drug therapy: Secondary | ICD-10-CM | POA: Diagnosis not present

## 2019-02-19 DIAGNOSIS — I10 Essential (primary) hypertension: Secondary | ICD-10-CM | POA: Insufficient documentation

## 2019-02-19 DIAGNOSIS — X58XXXA Exposure to other specified factors, initial encounter: Secondary | ICD-10-CM | POA: Insufficient documentation

## 2019-02-19 DIAGNOSIS — Z6839 Body mass index (BMI) 39.0-39.9, adult: Secondary | ICD-10-CM | POA: Diagnosis not present

## 2019-02-19 DIAGNOSIS — S83242A Other tear of medial meniscus, current injury, left knee, initial encounter: Secondary | ICD-10-CM | POA: Insufficient documentation

## 2019-02-19 DIAGNOSIS — K219 Gastro-esophageal reflux disease without esophagitis: Secondary | ICD-10-CM | POA: Insufficient documentation

## 2019-02-19 HISTORY — PX: KNEE ARTHROSCOPY WITH MEDIAL MENISECTOMY: SHX5651

## 2019-02-19 LAB — POCT PREGNANCY, URINE: Preg Test, Ur: NEGATIVE

## 2019-02-19 SURGERY — ARTHROSCOPY, KNEE, WITH MEDIAL MENISCECTOMY
Anesthesia: General | Site: Knee | Laterality: Left

## 2019-02-19 MED ORDER — DEXAMETHASONE SODIUM PHOSPHATE 4 MG/ML IJ SOLN
INTRAMUSCULAR | Status: DC | PRN
  Administered 2019-02-19: 4 mg via INTRAVENOUS

## 2019-02-19 MED ORDER — CEFAZOLIN SODIUM-DEXTROSE 2-4 GM/100ML-% IV SOLN
2.0000 g | INTRAVENOUS | Status: AC
  Administered 2019-02-19: 2 g via INTRAVENOUS

## 2019-02-19 MED ORDER — BUPIVACAINE-EPINEPHRINE 0.5% -1:200000 IJ SOLN
INTRAMUSCULAR | Status: DC | PRN
  Administered 2019-02-19: 14 mL

## 2019-02-19 MED ORDER — LACTATED RINGERS IV SOLN
INTRAVENOUS | Status: DC | PRN
  Administered 2019-02-19: 2 mL

## 2019-02-19 MED ORDER — OXYCODONE HCL 5 MG PO TABS
5.0000 mg | ORAL_TABLET | Freq: Once | ORAL | Status: AC | PRN
  Administered 2019-02-19: 5 mg via ORAL

## 2019-02-19 MED ORDER — CHLORHEXIDINE GLUCONATE 4 % EX LIQD: 60.0000 mL | Freq: Once | CUTANEOUS | Status: DC

## 2019-02-19 MED ORDER — ROPIVACAINE HCL 5 MG/ML IJ SOLN
INTRAMUSCULAR | Status: DC | PRN
  Administered 2019-02-19: 20 mL

## 2019-02-19 MED ORDER — LIDOCAINE HCL (CARDIAC) PF 100 MG/5ML IV SOSY
PREFILLED_SYRINGE | INTRAVENOUS | Status: DC | PRN
  Administered 2019-02-19: 30 mg via INTRATRACHEAL

## 2019-02-19 MED ORDER — ONDANSETRON HCL 4 MG/2ML IJ SOLN
INTRAMUSCULAR | Status: DC | PRN
  Administered 2019-02-19: 4 mg via INTRAVENOUS

## 2019-02-19 MED ORDER — FENTANYL CITRATE (PF) 100 MCG/2ML IJ SOLN
25.0000 ug | INTRAMUSCULAR | Status: DC | PRN
  Administered 2019-02-19: 50 ug via INTRAVENOUS
  Administered 2019-02-19 (×2): 25 ug via INTRAVENOUS

## 2019-02-19 MED ORDER — LACTATED RINGERS IV SOLN: INTRAVENOUS | Status: DC

## 2019-02-19 MED ORDER — DOCUSATE SODIUM 100 MG PO CAPS: 100.0000 mg | ORAL_CAPSULE | Freq: Every day | ORAL | 2 refills | Status: AC | PRN

## 2019-02-19 MED ORDER — GLYCOPYRROLATE 0.2 MG/ML IJ SOLN
INTRAMUSCULAR | Status: DC | PRN
  Administered 2019-02-19: .1 mg via INTRAVENOUS

## 2019-02-19 MED ORDER — HYDROCODONE-ACETAMINOPHEN 5-325 MG PO TABS: 1.0000 | ORAL_TABLET | ORAL | 0 refills | Status: AC | PRN

## 2019-02-19 MED ORDER — FENTANYL CITRATE (PF) 100 MCG/2ML IJ SOLN
INTRAMUSCULAR | Status: DC | PRN
  Administered 2019-02-19 (×4): 25 ug via INTRAVENOUS

## 2019-02-19 MED ORDER — OXYCODONE HCL 5 MG PO TABS
5.0000 mg | ORAL_TABLET | Freq: Once | ORAL | Status: AC
  Administered 2019-02-19: 5 mg via ORAL

## 2019-02-19 MED ORDER — MIDAZOLAM HCL 5 MG/5ML IJ SOLN
INTRAMUSCULAR | Status: DC | PRN
  Administered 2019-02-19: 2 mg via INTRAVENOUS

## 2019-02-19 MED ORDER — ONDANSETRON HCL 4 MG/2ML IJ SOLN: 4.0000 mg | Freq: Once | INTRAMUSCULAR | Status: DC | PRN

## 2019-02-19 MED ORDER — TRIAMCINOLONE ACETONIDE 40 MG/ML IJ SUSP
INTRAMUSCULAR | Status: DC | PRN
  Administered 2019-02-19: 1 mL

## 2019-02-19 MED ORDER — OXYCODONE HCL 5 MG/5ML PO SOLN: 5.0000 mg | Freq: Once | ORAL | Status: AC | PRN

## 2019-02-19 MED ORDER — KETOROLAC TROMETHAMINE 30 MG/ML IJ SOLN
30.0000 mg | Freq: Once | INTRAMUSCULAR | Status: AC
  Administered 2019-02-19: 30 mg via INTRAVENOUS

## 2019-02-19 MED ORDER — HYDROMORPHONE HCL 1 MG/ML IJ SOLN
0.2500 mg | INTRAMUSCULAR | Status: DC | PRN
  Administered 2019-02-19 (×2): 0.25 mg via INTRAVENOUS

## 2019-02-19 MED ORDER — PROPOFOL 10 MG/ML IV BOLUS
INTRAVENOUS | Status: DC | PRN
  Administered 2019-02-19: 200 mg via INTRAVENOUS

## 2019-02-19 SURGICAL SUPPLY — 33 items
APL PRP STRL LF DISP 70% ISPRP (MISCELLANEOUS) ×1
BLADE SHAVER 4.5 DBL SERAT CV (CUTTER) ×2 IMPLANT
BLADE SURG SZ11 CARB STEEL (BLADE) ×3 IMPLANT
CHLORAPREP W/TINT 26 (MISCELLANEOUS) ×3 IMPLANT
COOLER POLAR GLACIER W/PUMP (MISCELLANEOUS) ×3 IMPLANT
COVER LIGHT HANDLE UNIVERSAL (MISCELLANEOUS) ×6 IMPLANT
DRAPE IMP U-DRAPE 54X76 (DRAPES) ×3 IMPLANT
GAUZE SPONGE 4X4 12PLY STRL (GAUZE/BANDAGES/DRESSINGS) ×3 IMPLANT
GLOVE INDICATOR 8.0 STRL GRN (GLOVE) ×3 IMPLANT
GLOVE SURG ORTHO 8.0 STRL STRW (GLOVE) ×3 IMPLANT
GOWN STRL REUS W/ TWL LRG LVL3 (GOWN DISPOSABLE) ×1 IMPLANT
GOWN STRL REUS W/ TWL XL LVL3 (GOWN DISPOSABLE) ×1 IMPLANT
GOWN STRL REUS W/TWL LRG LVL3 (GOWN DISPOSABLE) ×3
GOWN STRL REUS W/TWL XL LVL3 (GOWN DISPOSABLE) ×3
IV LACTATED RINGER IRRG 3000ML (IV SOLUTION) ×6
IV LR IRRIG 3000ML ARTHROMATIC (IV SOLUTION) ×2 IMPLANT
KIT TURNOVER KIT A (KITS) ×3 IMPLANT
MANIFOLD NEPTUNE II (INSTRUMENTS) ×3 IMPLANT
MAT ABSORB  FLUID 56X50 GRAY (MISCELLANEOUS) ×2
MAT ABSORB FLUID 56X50 GRAY (MISCELLANEOUS) ×1 IMPLANT
NDL 18GX1X1/2 (RX/OR ONLY) (NEEDLE) ×1 IMPLANT
NDL HYPO TW 22X1.5 (NEEDLE) ×3 IMPLANT
NEEDLE 18GX1X1/2 (RX/OR ONLY) (NEEDLE) ×3 IMPLANT
PACK ARTHROSCOPY KNEE (MISCELLANEOUS) ×3 IMPLANT
PAD ABD DERMACEA PRESS 5X9 (GAUZE/BANDAGES/DRESSINGS) ×6 IMPLANT
PAD WRAPON POLAR KNEE (MISCELLANEOUS) ×1 IMPLANT
SUT ETHILON 4-0 (SUTURE) ×3
SUT ETHILON 4-0 FS2 18XMFL BLK (SUTURE) ×1
SUTURE ETHLN 4-0 FS2 18XMF BLK (SUTURE) ×1 IMPLANT
SYR 10ML LL (SYRINGE) ×3 IMPLANT
TUBING ARTHRO INFLOW-ONLY STRL (TUBING) ×3 IMPLANT
WAND WEREWOLF FLOW 90D (MISCELLANEOUS) ×3 IMPLANT
WRAPON POLAR PAD KNEE (MISCELLANEOUS) ×3

## 2019-02-19 NOTE — Op Note (Signed)
PATIENT:  Zoe Durham  PRE-OPERATIVE DIAGNOSIS:  S83.207A unspecified tear of unspecified meniscus current injury left knee  POST-OPERATIVE DIAGNOSIS:  Same  PROCEDURE:  LEFT KNEE ARTHROSCOPY WITH PARTIAL MEDIAL MENISECTOMY, PARTIAL SYNOVECTOMY, CHONDROPLASTY  SURGEON:  Kurtis Bushman, MD  ANESTHESIA:   General  PREOPERATIVE INDICATIONS:  Zoe Durham  49 y.o. female with a diagnosis of S83.207A unspecified tear of unspecified meniscus current injury left knee who failed conservative management and elected for surgical management.    The risks benefits and alternatives were discussed with the patient preoperatively including the risks of infection, bleeding, nerve injury, knee stiffness, persistent pain, osteoarthritis and the need for further surgery. Medical  risks include DVT and pulmonary embolism, myocardial infarction, stroke, pneumonia, respiratory failure and death. The patient understood these risks and wished to proceed.   OPERATIVE FINDINGS: The suprapatellar pouch, medial and lateral gutters were inspected and found to be free of any loose bodies. The undersurface of the patella and landing zone were inspected and grade 1 chondromalacia was noted. There was mild evidence of lateral subluxation. The medial compartment showed a radial tear of the posterior horn, then anterior horn was intact and stable to probing. 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  The notch was inspected and the ACL was intact and stable to probing. There was partial tearing of the medial fibers of the PCL. The lateral compartment was entered and minimal degenerative changes were identified. The lateral meniscus had degenerative tearing along the posterior horn. The anterior horn was intact a stable.  OPERATIVE PROCEDURE: Patient was met in the preoperative area. The operative extremity was signed with my initials according the hospital's correct site of surgery protocol.  The patient  was brought to the operating room where they was placed supine on the operative table. General anesthesia was administered. The patient was prepped and draped in a sterile fashion.  A timeout was performed to verify the patient's name, date of birth, medical record number, correct site of surgery correct procedure to be performed. It was also used to verify the patient received antibiotics that all appropriate instruments, and radiographic studies were available in the room. Once all in attendance were in agreement, the case began.  Proposed arthroscopy incisions were drawn out with a surgical marker. These were pre-injected with 0.5% marcaine with epinephrine. An 11 blade was used to establish an inferior lateral and inferomedial portals. The inferomedial portal was created using a 18-gauge spinal needle under direct visualization.  A full diagnostic examination of the knee was performed, please see findings for a complete list of results.  Patient had the meniscal tear treated with a 4-0 resector shaver blade and straight duckbill basket. The meniscus was debrided until a stable rim was achieved. A chondroplasty was performed in both the medial and patellofemoral compartments using the shaver on reverse burr mode. A partial synovectomy was also performed in all three compartments using a 4-0 resector shaver blade and electrocautery. The PCL was debrided to a stable configuration. Approximately 10% of the ligament was involved.  The knee was then copiously lavaged. All arthroscopic instruments were removed. The 2 arthroscopy portals were closed with 4-0 nylon. A dry sterile and compressive dressing was applied. The patient was brought to the PACU in stable condition. I was scrubbed and present for the entire case and all sharp and instrument counts were correct at the conclusion the case. I spoke with the patient's family postoperatively to let them know the case was  performed without complication and the  patient was stable in the recovery room.  Kurtis Bushman, MD

## 2019-02-19 NOTE — Anesthesia Preprocedure Evaluation (Signed)
Anesthesia Evaluation  Patient identified by MRN, date of birth, ID band Patient awake    Reviewed: Allergy & Precautions, H&P , NPO status , Patient's Chart, lab work & pertinent test results, reviewed documented beta blocker date and time   Airway Mallampati: II  TM Distance: >3 FB Neck ROM: full    Dental no notable dental hx.    Pulmonary neg pulmonary ROS,    Pulmonary exam normal breath sounds clear to auscultation       Cardiovascular Exercise Tolerance: Good hypertension,  Rhythm:regular Rate:Normal     Neuro/Psych negative neurological ROS  negative psych ROS   GI/Hepatic Neg liver ROS, GERD  ,  Endo/Other  negative endocrine ROS  Renal/GU negative Renal ROS  negative genitourinary   Musculoskeletal   Abdominal   Peds  Hematology negative hematology ROS (+)   Anesthesia Other Findings   Reproductive/Obstetrics negative OB ROS                             Anesthesia Physical Anesthesia Plan  ASA: II  Anesthesia Plan: General   Post-op Pain Management:    Induction:   PONV Risk Score and Plan: 3 and Ondansetron, Dexamethasone and Treatment may vary due to age or medical condition  Airway Management Planned:   Additional Equipment:   Intra-op Plan:   Post-operative Plan:   Informed Consent: I have reviewed the patients History and Physical, chart, labs and discussed the procedure including the risks, benefits and alternatives for the proposed anesthesia with the patient or authorized representative who has indicated his/her understanding and acceptance.     Dental Advisory Given  Plan Discussed with: CRNA  Anesthesia Plan Comments:         Anesthesia Quick Evaluation

## 2019-02-19 NOTE — H&P (Signed)
The patient has been re-examined, and the chart reviewed, and there have been no interval changes to the documented history and physical.  Plan a left knee scope today.  Anesthesia is not consulted regarding a peripheral nerve block for post-operative pain.  The risks, benefits, and alternatives have been discussed at length, and the patient is willing to proceed.     

## 2019-02-19 NOTE — Transfer of Care (Signed)
Immediate Anesthesia Transfer of Care Note  Patient: Zoe Durham  Procedure(s) Performed: KNEE ARTHROSCOPY WITH PARTIAL MEDIAL MENISECTOMY, PARTIAL SYNOVECTOMY, CHONDROPLASTY (Left Knee)  Patient Location: PACU  Anesthesia Type: General  Level of Consciousness: awake, alert  and patient cooperative  Airway and Oxygen Therapy: Patient Spontanous Breathing and Patient connected to supplemental oxygen  Post-op Assessment: Post-op Vital signs reviewed, Patient's Cardiovascular Status Stable, Respiratory Function Stable, Patent Airway and No signs of Nausea or vomiting  Post-op Vital Signs: Reviewed and stable  Complications: No apparent anesthesia complications

## 2019-02-19 NOTE — Anesthesia Procedure Notes (Signed)
Procedure Name: LMA Insertion Date/Time: 02/19/2019 12:08 PM Performed by: Maree Krabbe, CRNA Pre-anesthesia Checklist: Patient identified, Emergency Drugs available, Suction available, Timeout performed and Patient being monitored Patient Re-evaluated:Patient Re-evaluated prior to induction Oxygen Delivery Method: Circle system utilized Preoxygenation: Pre-oxygenation with 100% oxygen Induction Type: IV induction LMA: LMA inserted LMA Size: 4.0 Number of attempts: 1 Placement Confirmation: positive ETCO2 and breath sounds checked- equal and bilateral Tube secured with: Tape Dental Injury: Teeth and Oropharynx as per pre-operative assessment

## 2019-02-19 NOTE — Anesthesia Postprocedure Evaluation (Signed)
Anesthesia Post Note  Patient: Zoe Durham  Procedure(s) Performed: KNEE ARTHROSCOPY WITH PARTIAL MEDIAL MENISECTOMY, PARTIAL SYNOVECTOMY, CHONDROPLASTY (Left Knee)     Patient location during evaluation: PACU Anesthesia Type: General Level of consciousness: awake and alert Pain management: pain level controlled Vital Signs Assessment: post-procedure vital signs reviewed and stable Respiratory status: spontaneous breathing, nonlabored ventilation, respiratory function stable and patient connected to nasal cannula oxygen Cardiovascular status: blood pressure returned to baseline and stable Postop Assessment: no apparent nausea or vomiting Anesthetic complications: no    Scarlette Slice

## 2019-02-19 NOTE — Discharge Instructions (Signed)
Post Op Home Instructions for Knee Arthroscopy  1) Do not sit for longer than 1 hour at a time with your leg dangling down.  You should have your legs elevated (higher than your heart) in a recliner chair or couch.  2) You may be up walking around as tolerated but should take periodic breaks to elevate your legs.  Discontinue use of crutches when you feel you are able to walk without pain or a limp.  3) Work on gentle bending and straightening of the knee.  4) You may remove the Ace wrap and dressings two days after surgery.  Place band aids over the incision sites.  5) You may shower after you remove the surgical dressing.  You do not need to cover the incision with plastic wrap.  The incision can get wet, but do not submerge under water.  After your sutures have been removed, you should wait 24 hours before submerging incision under water.  6) Pain medication can cause constipation.  You should increase your fluid intake, increase your intake of high fiber foods and/or take Metamucil as needed for constipation.  7) Continue your physical therapy exercises, as shown at the office, at least twice daily.  You should set up outpatient physical therapy and start within the first week after surgery.  8) Continue to use your Polar Pack continuously for 2-3 days after surgery.  After you remove the surgical dressing, it is a good idea to use your Polar Pack or ice pack for 30 minutes after doing your exercises to reduce swelling.  9) Do not be surprised if you have increased pain at night.  This usually means you have been a little too active during the day and need to reduce your activities.  10) If you develop lower extremity swelling that does not improve after a night of elevation, please call the office.  This could be an early sign of a blood clot.  Please call with any questions at [phone]    General Anesthesia, Adult, Care After This sheet gives you information about how to care for  yourself after your procedure. Your health care provider may also give you more specific instructions. If you have problems or questions, contact your health care provider. What can I expect after the procedure? After the procedure, the following side effects are common:  Pain or discomfort at the IV site.  Nausea.  Vomiting.  Sore throat.  Trouble concentrating.  Feeling cold or chills.  Weak or tired.  Sleepiness and fatigue.  Soreness and body aches. These side effects can affect parts of the body that were not involved in surgery. Follow these instructions at home:  For at least 24 hours after the procedure:  Have a responsible adult stay with you. It is important to have someone help care for you until you are awake and alert.  Rest as needed.  Do not: ? Participate in activities in which you could fall or become injured. ? Drive. ? Use heavy machinery. ? Drink alcohol. ? Take sleeping pills or medicines that cause drowsiness. ? Make important decisions or sign legal documents. ? Take care of children on your own. Eating and drinking  Follow any instructions from your health care provider about eating or drinking restrictions.  When you feel hungry, start by eating small amounts of foods that are soft and easy to digest (bland), such as toast. Gradually return to your regular diet.  Drink enough fluid to keep your urine pale yellow.    If you vomit, rehydrate by drinking water, juice, or clear broth. General instructions  If you have sleep apnea, surgery and certain medicines can increase your risk for breathing problems. Follow instructions from your health care provider about wearing your sleep device: ? Anytime you are sleeping, including during daytime naps. ? While taking prescription pain medicines, sleeping medicines, or medicines that make you drowsy.  Return to your normal activities as told by your health care provider. Ask your health care provider  what activities are safe for you.  Take over-the-counter and prescription medicines only as told by your health care provider.  If you smoke, do not smoke without supervision.  Keep all follow-up visits as told by your health care provider. This is important. Contact a health care provider if:  You have nausea or vomiting that does not get better with medicine.  You cannot eat or drink without vomiting.  You have pain that does not get better with medicine.  You are unable to pass urine.  You develop a skin rash.  You have a fever.  You have redness around your IV site that gets worse. Get help right away if:  You have difficulty breathing.  You have chest pain.  You have blood in your urine or stool, or you vomit blood. Summary  After the procedure, it is common to have a sore throat or nausea. It is also common to feel tired.  Have a responsible adult stay with you for the first 24 hours after general anesthesia. It is important to have someone help care for you until you are awake and alert.  When you feel hungry, start by eating small amounts of foods that are soft and easy to digest (bland), such as toast. Gradually return to your regular diet.  Drink enough fluid to keep your urine pale yellow.  Return to your normal activities as told by your health care provider. Ask your health care provider what activities are safe for you. This information is not intended to replace advice given to you by your health care provider. Make sure you discuss any questions you have with your health care provider. Document Revised: 01/28/2017 Document Reviewed: 09/10/2016 [employer] Patient Education  2020 [employer]  

## 2019-02-20 ENCOUNTER — Encounter: Payer: Self-pay | Admitting: *Deleted

## 2019-02-28 ENCOUNTER — Other Ambulatory Visit: Payer: Self-pay | Admitting: Family Medicine

## 2019-02-28 DIAGNOSIS — Z1231 Encounter for screening mammogram for malignant neoplasm of breast: Secondary | ICD-10-CM

## 2019-03-05 ENCOUNTER — Ambulatory Visit
Admission: RE | Admit: 2019-03-05 | Discharge: 2019-03-05 | Disposition: A | Discharge status: Home / Self Care | Payer: Managed Care, Other (non HMO) | Source: Ambulatory Visit | Attending: Family Medicine | Admitting: Family Medicine

## 2019-03-05 DIAGNOSIS — Z1231 Encounter for screening mammogram for malignant neoplasm of breast: Secondary | ICD-10-CM | POA: Diagnosis not present

## 2020-01-20 ENCOUNTER — Encounter (INDEPENDENT_AMBULATORY_CARE_PROVIDER_SITE_OTHER): Payer: Self-pay

## 2020-05-15 ENCOUNTER — Other Ambulatory Visit: Payer: Self-pay | Admitting: Family Medicine

## 2020-05-15 DIAGNOSIS — Z1231 Encounter for screening mammogram for malignant neoplasm of breast: Secondary | ICD-10-CM

## 2020-05-30 ENCOUNTER — Ambulatory Visit
Admission: RE | Admit: 2020-05-30 | Discharge: 2020-05-30 | Disposition: A | Discharge status: Home / Self Care | Payer: Managed Care, Other (non HMO) | Source: Ambulatory Visit | Attending: Family Medicine | Admitting: Family Medicine

## 2020-05-30 ENCOUNTER — Other Ambulatory Visit: Payer: Self-pay

## 2020-05-30 DIAGNOSIS — Z1231 Encounter for screening mammogram for malignant neoplasm of breast: Secondary | ICD-10-CM | POA: Insufficient documentation

## 2021-07-07 ENCOUNTER — Other Ambulatory Visit: Payer: Self-pay | Admitting: Family Medicine

## 2021-07-07 DIAGNOSIS — Z1231 Encounter for screening mammogram for malignant neoplasm of breast: Secondary | ICD-10-CM

## 2021-07-30 ENCOUNTER — Ambulatory Visit
Admission: RE | Admit: 2021-07-30 | Discharge: 2021-07-30 | Disposition: A | Discharge status: Home / Self Care | Payer: Managed Care, Other (non HMO) | Source: Ambulatory Visit | Attending: Family Medicine | Admitting: Family Medicine

## 2021-07-30 DIAGNOSIS — Z1231 Encounter for screening mammogram for malignant neoplasm of breast: Secondary | ICD-10-CM | POA: Diagnosis present

## 2021-07-31 ENCOUNTER — Other Ambulatory Visit: Payer: Self-pay | Admitting: Family Medicine

## 2021-08-05 ENCOUNTER — Other Ambulatory Visit: Payer: Self-pay | Admitting: Family Medicine

## 2021-08-05 DIAGNOSIS — R928 Other abnormal and inconclusive findings on diagnostic imaging of breast: Secondary | ICD-10-CM

## 2021-08-05 DIAGNOSIS — N63 Unspecified lump in unspecified breast: Secondary | ICD-10-CM

## 2021-08-21 ENCOUNTER — Ambulatory Visit
Admission: RE | Admit: 2021-08-21 | Discharge: 2021-08-21 | Disposition: A | Discharge status: Home / Self Care | Payer: Managed Care, Other (non HMO) | Source: Ambulatory Visit | Attending: Family Medicine | Admitting: Family Medicine

## 2021-08-21 DIAGNOSIS — N63 Unspecified lump in unspecified breast: Secondary | ICD-10-CM

## 2021-08-21 DIAGNOSIS — R928 Other abnormal and inconclusive findings on diagnostic imaging of breast: Secondary | ICD-10-CM

## 2021-09-22 ENCOUNTER — Other Ambulatory Visit: Payer: Self-pay | Admitting: Family Medicine

## 2021-09-22 DIAGNOSIS — N6001 Solitary cyst of right breast: Secondary | ICD-10-CM

## 2022-02-22 ENCOUNTER — Other Ambulatory Visit: Payer: Managed Care, Other (non HMO)

## 2022-12-13 ENCOUNTER — Other Ambulatory Visit: Payer: Self-pay | Admitting: Family Medicine

## 2022-12-13 DIAGNOSIS — Z1231 Encounter for screening mammogram for malignant neoplasm of breast: Secondary | ICD-10-CM

## 2022-12-13 DIAGNOSIS — N63 Unspecified lump in unspecified breast: Secondary | ICD-10-CM

## 2022-12-24 ENCOUNTER — Ambulatory Visit
Admission: RE | Admit: 2022-12-24 | Discharge: 2022-12-24 | Disposition: A | Discharge status: Home / Self Care | Payer: Managed Care, Other (non HMO) | Source: Ambulatory Visit | Attending: Family Medicine | Admitting: Family Medicine

## 2022-12-24 DIAGNOSIS — Z1231 Encounter for screening mammogram for malignant neoplasm of breast: Secondary | ICD-10-CM | POA: Diagnosis present

## 2022-12-24 DIAGNOSIS — N63 Unspecified lump in unspecified breast: Secondary | ICD-10-CM | POA: Insufficient documentation

## 2024-02-23 ENCOUNTER — Other Ambulatory Visit: Payer: Self-pay | Admitting: Family Medicine

## 2024-02-23 DIAGNOSIS — Z1231 Encounter for screening mammogram for malignant neoplasm of breast: Secondary | ICD-10-CM

## 2024-02-23 DIAGNOSIS — N63 Unspecified lump in unspecified breast: Secondary | ICD-10-CM

## 2024-03-06 ENCOUNTER — Encounter

## 2024-03-06 ENCOUNTER — Other Ambulatory Visit

## 2024-03-14 ENCOUNTER — Encounter

## 2024-03-14 ENCOUNTER — Other Ambulatory Visit

## 2024-03-27 ENCOUNTER — Other Ambulatory Visit

## 2024-03-27 ENCOUNTER — Encounter
